# Patient Record
Sex: Female | Born: 1945 | Race: Black or African American | Hispanic: No | Marital: Married | Smoking: Never smoker
Health system: Southern US, Community
[De-identification: ages and names within clinical notes are randomized; demographics above are authoritative.]

## PROBLEM LIST (undated history)

## (undated) DIAGNOSIS — Z8041 Family history of malignant neoplasm of ovary: Secondary | ICD-10-CM

## (undated) DIAGNOSIS — E7800 Pure hypercholesterolemia, unspecified: Secondary | ICD-10-CM

## (undated) DIAGNOSIS — F0390 Unspecified dementia without behavioral disturbance: Secondary | ICD-10-CM

## (undated) DIAGNOSIS — R413 Other amnesia: Secondary | ICD-10-CM

## (undated) DIAGNOSIS — I10 Essential (primary) hypertension: Secondary | ICD-10-CM

## (undated) HISTORY — PX: BREAST SURGERY: SHX581

## (undated) HISTORY — DX: Other amnesia: R41.3

## (undated) HISTORY — DX: Family history of malignant neoplasm of ovary: Z80.41

## (undated) HISTORY — DX: Unspecified dementia, unspecified severity, without behavioral disturbance, psychotic disturbance, mood disturbance, and anxiety: F03.90

## (undated) HISTORY — DX: Pure hypercholesterolemia, unspecified: E78.00

## (undated) HISTORY — DX: Essential (primary) hypertension: I10

---

## 1975-03-29 HISTORY — PX: TUBAL LIGATION: SHX77

## 1995-03-29 HISTORY — PX: ENDOMETRIAL ABLATION: SHX621

## 1998-10-01 ENCOUNTER — Other Ambulatory Visit: Admission: RE | Admit: 1998-10-01 | Discharge: 1998-10-01 | Payer: Self-pay | Admitting: Gynecology

## 1998-10-01 ENCOUNTER — Encounter (INDEPENDENT_AMBULATORY_CARE_PROVIDER_SITE_OTHER): Payer: Self-pay | Admitting: Specialist

## 2000-03-28 HISTORY — PX: COLONOSCOPY W/ POLYPECTOMY: SHX1380

## 2000-11-16 ENCOUNTER — Other Ambulatory Visit: Admission: RE | Admit: 2000-11-16 | Discharge: 2000-11-16 | Payer: Self-pay | Admitting: Gynecology

## 2001-01-10 ENCOUNTER — Ambulatory Visit (HOSPITAL_COMMUNITY): Admission: RE | Admit: 2001-01-10 | Discharge: 2001-01-10 | Payer: Self-pay | Admitting: Gastroenterology

## 2001-03-07 ENCOUNTER — Ambulatory Visit (HOSPITAL_COMMUNITY): Admission: RE | Admit: 2001-03-07 | Discharge: 2001-03-07 | Payer: Self-pay | Admitting: Gastroenterology

## 2001-03-07 ENCOUNTER — Encounter (INDEPENDENT_AMBULATORY_CARE_PROVIDER_SITE_OTHER): Payer: Self-pay | Admitting: *Deleted

## 2001-11-20 ENCOUNTER — Other Ambulatory Visit: Admission: RE | Admit: 2001-11-20 | Discharge: 2001-11-20 | Payer: Self-pay | Admitting: Gynecology

## 2002-11-22 ENCOUNTER — Other Ambulatory Visit: Admission: RE | Admit: 2002-11-22 | Discharge: 2002-11-22 | Payer: Self-pay | Admitting: Gynecology

## 2004-01-01 ENCOUNTER — Other Ambulatory Visit: Admission: RE | Admit: 2004-01-01 | Discharge: 2004-01-01 | Payer: Self-pay | Admitting: Gynecology

## 2005-01-10 ENCOUNTER — Other Ambulatory Visit: Admission: RE | Admit: 2005-01-10 | Discharge: 2005-01-10 | Payer: Self-pay | Admitting: Gynecology

## 2006-01-11 ENCOUNTER — Other Ambulatory Visit: Admission: RE | Admit: 2006-01-11 | Discharge: 2006-01-11 | Payer: Self-pay | Admitting: Gynecology

## 2007-01-15 ENCOUNTER — Other Ambulatory Visit: Admission: RE | Admit: 2007-01-15 | Discharge: 2007-01-15 | Payer: Self-pay | Admitting: Gynecology

## 2009-12-28 ENCOUNTER — Other Ambulatory Visit: Admission: RE | Admit: 2009-12-28 | Discharge: 2009-12-28 | Payer: Self-pay | Admitting: Gynecology

## 2009-12-28 ENCOUNTER — Ambulatory Visit: Payer: Self-pay | Admitting: Gynecology

## 2010-08-13 NOTE — Procedures (Signed)
. [facility]  Patient:    Elizabeth Castro, Elizabeth Castro Visit Number: [id number] MRN: [id number]          Service Type: END Location: ENDO Attending Physician:  Charna Elizabeth Dictated by:   Anselmo Rod, M.D. Proc. Date: 03/07/01 Admit Date:  03/07/2001   CC:         Juan H. Lily Peer, M.D.   Procedure Report  DATE OF BIRTH:  1945/11/12  REFERRING PHYSICIAN:  Gaetano Hawthorne. Lily Peer, M.D.  PROCEDURE PERFORMED:  Colonoscopy with snare polypectomy x 1 and hot biopsy x 1.  ENDOSCOPIST:  Anselmo Rod, M.D.  INSTRUMENT USED:  Olympus video colonoscope.  INDICATIONS FOR PROCEDURE:  Polyp seen on flexible sigmoidoscopy in a 65 year old African-American female, rule out colonic polyps in right and transverse colon.  Polypectomy is planned.  PREPROCEDURE PREPARATION:  Informed consent was procured from the patient. The patient was fasted for eight hours prior to the procedure and prepped with a bottle of magnesium citrate and a gallon of NuLytely the night prior to the procedure.  PREPROCEDURE PHYSICAL:  The patient had stable vital signs.  Neck supple. Chest clear to auscultation.  S1, S2 regular.  Abdomen soft with normal abdominal bowel sounds.  No evidence of hepatosplenomegaly.  DESCRIPTION OF PROCEDURE:  The patient was placed in the left lateral decubitus position and sedated with 60 mg of Demerol and 6 mg of Versed intravenously.  Once the patient was adequately sedated and maintained on low-flow oxygen and continuous cardiac monitoring, the Olympus video colonoscope was advanced from the rectum to the cecum with difficulty secondary to a large amount of residual stool in the colon.  Multiple washes were done.  A small polyp was seen at 90 cm. This was removed by hot biopsy forceps.  Another larger polyp measuring about 4 to 5 mm was removed by snare polypectomy from 10 cm.  Both the polyps were placed in the same bottle. Small lesions could  have been missed.  The procedure was complete up to the cecum. The ileocecal valve and the appendiceal orifice were clearly visualized and photographed.  No large masses or polyps were present.  IMPRESSION: 1. Two polyps removed from the colon as mentioned above. 2. Some residual stool in the colon.  Small lesions could have been missed. 3. No large masses or polyps seen.  RECOMMENDATIONS: 1. Await pathology results. 2. Avoid all non-steroidals including aspirin for the next four weeks. 3. Outpatient follow-up in the next four weeks for further recommendation.Dictated by:   Anselmo Rod, M.D. Attending Physician:  Charna Elizabeth DD:  03/07/01 TD:  03/07/01 Job: [id number]

## 2010-08-13 NOTE — Procedures (Signed)
[place]  Patient:    Elizabeth Castro, Elizabeth Castro Visit Number: [id number] MRN: [id number]          Service Type: END Location: ENDO Attending Physician:  Charna Elizabeth Dictated by:   Anselmo Rod, M.D. Proc. Date: 01/10/01 Admit Date:  01/10/2001   CC:         Wayne C. Dorna Bloom, M.D.   Procedure Report  DATE OF BIRTH:  May 02, 1945.  PROCEDURE:  Flexible sigmoidoscopy up to 50 cm.  ENDOSCOPIST:  Anselmo Rod, M.D.  INSTRUMENT USED:  Pediatric Olympus colonoscope.  INDICATION FOR PROCEDURE:  Screening flexible sigmoidoscopy being performed in a 65 year old African-American female.  Rule out colonic polyps, masses, hemorrhoids, etc.  PREPROCEDURE PREPARATION:  Informed consent was procured from the patient. The patient was fasted for eight hours prior to the procedure and prepped with two Fleets enemas the morning of the procedure.  PREPROCEDURE PHYSICAL:  VITAL SIGNS:  The patient had stable vital signs.  NECK:  Supple.  CHEST:  Clear to auscultation.  S1, S2 regular.  ABDOMEN:  Soft with normal bowel sounds.  DESCRIPTION OF PROCEDURE:  The patient was placed in the left lateral decubitus position.  No sedation was used.  Once the patient was adequately positioned, the Olympus video colonoscope was advanced from the rectum to 40 cm without difficulty.  There was some mucoid residue in the colon.  Small sessile polyp was seen at 10 cm, and no other abnormalities were noted.  The procedure was aborted at this point with plans to do a colonoscopy later.  IMPRESSION:  Polyp seen at 10 cm.  RECOMMENDATIONS:  Colonoscopy is indicated for the patient to rule out other colonic polyps and remove the polyp seen on flexible sigmoidoscopy today. Dictated by:   Anselmo Rod, M.D. Attending Physician:  Charna Elizabeth DD:  01/10/01 TD:  01/10/01 Job: [id number]

## 2011-01-05 ENCOUNTER — Encounter: Payer: Self-pay | Admitting: Anesthesiology

## 2011-01-11 ENCOUNTER — Other Ambulatory Visit (HOSPITAL_COMMUNITY)
Admission: RE | Admit: 2011-01-11 | Discharge: 2011-01-11 | Disposition: A | Discharge status: Home / Self Care | Payer: Medicare Other | Source: Ambulatory Visit | Attending: Gynecology | Admitting: Gynecology

## 2011-01-11 ENCOUNTER — Ambulatory Visit (INDEPENDENT_AMBULATORY_CARE_PROVIDER_SITE_OTHER): Payer: Medicare Other | Admitting: Gynecology

## 2011-01-11 ENCOUNTER — Encounter: Payer: Self-pay | Admitting: Gynecology

## 2011-01-11 DIAGNOSIS — Z131 Encounter for screening for diabetes mellitus: Secondary | ICD-10-CM

## 2011-01-11 DIAGNOSIS — R141 Gas pain: Secondary | ICD-10-CM

## 2011-01-11 DIAGNOSIS — E663 Overweight: Secondary | ICD-10-CM

## 2011-01-11 DIAGNOSIS — Z8041 Family history of malignant neoplasm of ovary: Secondary | ICD-10-CM

## 2011-01-11 DIAGNOSIS — E7800 Pure hypercholesterolemia, unspecified: Secondary | ICD-10-CM

## 2011-01-11 DIAGNOSIS — Z23 Encounter for immunization: Secondary | ICD-10-CM

## 2011-01-11 DIAGNOSIS — Z1211 Encounter for screening for malignant neoplasm of colon: Secondary | ICD-10-CM

## 2011-01-11 DIAGNOSIS — Z01419 Encounter for gynecological examination (general) (routine) without abnormal findings: Secondary | ICD-10-CM

## 2011-01-11 DIAGNOSIS — Z780 Asymptomatic menopausal state: Secondary | ICD-10-CM

## 2011-01-11 DIAGNOSIS — Z124 Encounter for screening for malignant neoplasm of cervix: Secondary | ICD-10-CM | POA: Insufficient documentation

## 2011-01-11 DIAGNOSIS — Z0000 Encounter for general adult medical examination without abnormal findings: Secondary | ICD-10-CM

## 2011-01-11 DIAGNOSIS — R140 Abdominal distension (gaseous): Secondary | ICD-10-CM

## 2011-01-11 DIAGNOSIS — R142 Eructation: Secondary | ICD-10-CM

## 2011-01-11 LAB — POC HEMOCCULT BLD/STL (OFFICE/1-CARD/DIAGNOSTIC): Fecal Occult Blood, POC: NEGATIVE

## 2011-01-11 NOTE — Progress Notes (Signed)
Elizabeth Castro 03/25/1946 [id number]   History:    65 y.o. who has a strong family history of ovarian cancer in her mother. The patient is overweight she weighs 242 pounds she is taking her calcium and vitamin D daily I have recommended her to get twice a day she is being followed for her hypercholesterolemia by Dr. Eula Castro who she has not seen in a year. She has a followup appointment this Thursday with the gastroenterologists decided she's had colonoscopy in 2002 with colonic polyps. She is fasting today. She is overdue for mammogram. Her last bone density study was normal in 2007.   Past medical history,surgical history, family history and social history were all reviewed and documented in the EPIC chart.  ROS:  Was performed and pertinent positives and negatives are included in the history.  Exam: chaperone present BP 130/74  Ht 5\' 5"  (1.651 m)  Wt 242 lb (109.77 kg)  BMI 40.27 kg/m2  Body mass index is 40.27 kg/(m^2).  General appearance : Well developed well nourished female. No acute distress HEENT: Neck supple, trachea midline, no carotid bruits, no thyroidmegaly Lungs: Clear to auscultation, no rhonchi or wheezes, or rib retractions  Heart: Regular rate and rhythm, no murmurs or gallops Breast:Examined in sitting and supine position were symmetrical in appearance, no palpable masses or tenderness,  no skin retraction, no nipple inversion, no nipple discharge, no skin discoloration, no axillary or supraclavicular lymphadenopathy Abdomen: no palpable masses or tenderness, no rebound or guarding Extremities: no edema or skin discoloration or tenderness  Pelvic:  Bartholin, Urethra, Skene Glands: Within normal limits             Vagina: No gross lesions or discharge  Cervix: No gross lesions or discharge  Uterus  anteverted, normal size, shape and consistency, non-tender and mobile  Adnexa  Without masses or tenderness  Anus and perineum  normal   Rectovaginal  normal sphincter  tone without palpated masses or tenderness             Hemoccult obtained results pending at time of this dictation     Assessment/Plan:  65 y.o. female unremarkable gynecological examination. We'll do a fasting lipid profile along with fasting blood sugar CBC urinalysis and Pap smear along with a CEA 125 Hemoccult test pending at time of dictation. She will schedule an ultrasound next week due to family history of ovarian cancer in her being overweight. The following module scheduled for bone density study which is overdue as well. She was encouraged to increase her calcium and vitamin D twice a day. Patient with prior history last year of a left breast biopsy which was reported to be a benign fibroadenoma. She was requesting a flu shot risk benefits and pros and cons were discussed consent form was signed and he was administered. She was scheduled her mammogram according. We'll wait for the results of the colonoscopy next 2 weeks. Was here back in one year or when necessary.    Elizabeth Edwards MD, 12:58 PM 01/11/2011

## 2011-01-25 ENCOUNTER — Ambulatory Visit (INDEPENDENT_AMBULATORY_CARE_PROVIDER_SITE_OTHER): Payer: Medicare Other

## 2011-01-25 DIAGNOSIS — Z780 Asymptomatic menopausal state: Secondary | ICD-10-CM

## 2011-01-25 DIAGNOSIS — Z13820 Encounter for screening for osteoporosis: Secondary | ICD-10-CM

## 2011-01-28 ENCOUNTER — Ambulatory Visit (INDEPENDENT_AMBULATORY_CARE_PROVIDER_SITE_OTHER): Payer: Medicare Other

## 2011-01-28 ENCOUNTER — Other Ambulatory Visit (HOSPITAL_COMMUNITY)
Admission: RE | Admit: 2011-01-28 | Discharge: 2011-01-28 | Disposition: A | Discharge status: Home / Self Care | Payer: Medicare Other | Source: Ambulatory Visit | Attending: Gynecology | Admitting: Gynecology

## 2011-01-28 ENCOUNTER — Ambulatory Visit (INDEPENDENT_AMBULATORY_CARE_PROVIDER_SITE_OTHER): Payer: Medicare Other | Admitting: Gynecology

## 2011-01-28 ENCOUNTER — Encounter: Payer: Self-pay | Admitting: Gynecology

## 2011-01-28 VITALS — BP 130/82

## 2011-01-28 DIAGNOSIS — Z124 Encounter for screening for malignant neoplasm of cervix: Secondary | ICD-10-CM | POA: Insufficient documentation

## 2011-01-28 DIAGNOSIS — Z8041 Family history of malignant neoplasm of ovary: Secondary | ICD-10-CM

## 2011-01-28 DIAGNOSIS — E7800 Pure hypercholesterolemia, unspecified: Secondary | ICD-10-CM

## 2011-01-28 DIAGNOSIS — R87616 Satisfactory cervical smear but lacking transformation zone: Secondary | ICD-10-CM

## 2011-01-28 NOTE — Progress Notes (Signed)
Patient presented to the office today due to the fact that time of her annual exam recently the Pap smear demonstrated that there was no endocervical cells for evaluation. Review of her record indicated her recent CBC urinalysis and blood sugar was normal. Her lipid profile demonstrated her total cholesterol elevated 216 as was her triglycerides at 182 and LDL of 132. She was encouraged to increase her diet with vegetables fruits and fiber as well as low-fat diet and regular exercise. She is currently on Zocor place by her primary physician. Patient's mother had ovarian cancer and due to patient's abdominal girth needed difficult for an accurate assessment of her adnexa and ultrasound had been ordered and was happy to inform her ultrasound demonstrated normal ovaries and she still has for very small insignificant fibroids. Her endometrial stripe of 3.2 mm and the uterus was otherwise normal size. Her recent bone density study was normal. She'll continue with her calcium and vitamin D is apparently prescribed for osteoporosis prevention.

## 2011-03-08 ENCOUNTER — Telehealth: Payer: Self-pay | Admitting: *Deleted

## 2011-03-08 NOTE — Telephone Encounter (Signed)
Patient called for pap results.  Informed normal.

## 2011-03-10 ENCOUNTER — Encounter: Payer: Self-pay | Admitting: Gynecology

## 2011-07-05 ENCOUNTER — Other Ambulatory Visit: Payer: Self-pay | Admitting: *Deleted

## 2011-07-05 DIAGNOSIS — E7800 Pure hypercholesterolemia, unspecified: Secondary | ICD-10-CM

## 2011-08-04 DIAGNOSIS — N182 Chronic kidney disease, stage 2 (mild): Secondary | ICD-10-CM | POA: Diagnosis not present

## 2011-08-04 DIAGNOSIS — E559 Vitamin D deficiency, unspecified: Secondary | ICD-10-CM | POA: Diagnosis not present

## 2011-08-04 DIAGNOSIS — Z23 Encounter for immunization: Secondary | ICD-10-CM | POA: Diagnosis not present

## 2011-08-04 DIAGNOSIS — Z0000 Encounter for general adult medical examination without abnormal findings: Secondary | ICD-10-CM | POA: Diagnosis not present

## 2011-08-04 DIAGNOSIS — I10 Essential (primary) hypertension: Secondary | ICD-10-CM | POA: Diagnosis not present

## 2011-08-04 DIAGNOSIS — E782 Mixed hyperlipidemia: Secondary | ICD-10-CM | POA: Diagnosis not present

## 2011-08-05 DIAGNOSIS — Z1231 Encounter for screening mammogram for malignant neoplasm of breast: Secondary | ICD-10-CM | POA: Diagnosis not present

## 2011-08-11 ENCOUNTER — Encounter: Payer: Self-pay | Admitting: Gynecology

## 2012-01-12 ENCOUNTER — Ambulatory Visit (INDEPENDENT_AMBULATORY_CARE_PROVIDER_SITE_OTHER): Payer: Medicare Other

## 2012-01-12 ENCOUNTER — Encounter: Payer: Self-pay | Admitting: Gynecology

## 2012-01-12 ENCOUNTER — Ambulatory Visit (INDEPENDENT_AMBULATORY_CARE_PROVIDER_SITE_OTHER): Payer: Medicare Other | Admitting: Gynecology

## 2012-01-12 VITALS — BP 128/86 | Ht 64.5 in | Wt 210.0 lb

## 2012-01-12 DIAGNOSIS — D259 Leiomyoma of uterus, unspecified: Secondary | ICD-10-CM | POA: Diagnosis not present

## 2012-01-12 DIAGNOSIS — N83339 Acquired atrophy of ovary and fallopian tube, unspecified side: Secondary | ICD-10-CM | POA: Diagnosis not present

## 2012-01-12 DIAGNOSIS — Z8041 Family history of malignant neoplasm of ovary: Secondary | ICD-10-CM

## 2012-01-12 DIAGNOSIS — N952 Postmenopausal atrophic vaginitis: Secondary | ICD-10-CM | POA: Diagnosis not present

## 2012-01-12 DIAGNOSIS — Z780 Asymptomatic menopausal state: Secondary | ICD-10-CM

## 2012-01-12 DIAGNOSIS — D219 Benign neoplasm of connective and other soft tissue, unspecified: Secondary | ICD-10-CM

## 2012-01-12 DIAGNOSIS — R634 Abnormal weight loss: Secondary | ICD-10-CM | POA: Diagnosis not present

## 2012-01-12 DIAGNOSIS — Z23 Encounter for immunization: Secondary | ICD-10-CM

## 2012-01-12 DIAGNOSIS — R140 Abdominal distension (gaseous): Secondary | ICD-10-CM

## 2012-01-12 DIAGNOSIS — R141 Gas pain: Secondary | ICD-10-CM

## 2012-01-12 DIAGNOSIS — D251 Intramural leiomyoma of uterus: Secondary | ICD-10-CM | POA: Diagnosis not present

## 2012-01-12 NOTE — Progress Notes (Signed)
Elizabeth Castro [birth year] [id number]   History:    66 y.o.  for annual gyn exam with no major complaints except that time she has vaginal dryness. Patient's mother had ovarian cancer. Patient with annual ultrasounds and CA 125. Patient with several small uterine fibroids that have been stable throughout the years. At times she has felt some bloating sensation. Otherwise she's been doing well. Her last bone density study was normal in 2012. She is taking calcium with vitamin D twice a day. She frequently does her self breast examination. Her mammogram was normal this year.  Her primary physician is Dr. Eula Listen who has been doing her lab work in following her for her hypercholesterolemia and hypertension. Patient many years ago had benign left breast biopsy. Patient states that she has received her shingles vaccine. She is not sure about her Tdap vaccine.  Past medical history,surgical history, family history and social history were all reviewed and documented in the EPIC chart.  Gynecologic History No LMP recorded. Patient is postmenopausal. Contraception: tubal ligation Last Pap: 2012. Results were: normal Last mammogram: 2013. Results were: normal  Obstetric History OB History    Grav Para Term Preterm Abortions TAB SAB Ect Mult Living   2 2        2      # Outc Date GA Lbr Len/2nd Wgt Sex Del Anes PTL Lv   1 PAR     F SVD      2 PAR     F SVD          ROS: A ROS was performed and pertinent positives and negatives are included in the history.  GENERAL: No fevers or chills. HEENT: No change in vision, no earache, sore throat or sinus congestion. NECK: No pain or stiffness. CARDIOVASCULAR: No chest pain or pressure. No palpitations. PULMONARY: No shortness of breath, cough or wheeze. GASTROINTESTINAL: No abdominal pain, nausea, vomiting or diarrhea, melena or bright red blood per rectum. GENITOURINARY: No urinary frequency, urgency, hesitancy or dysuria. MUSCULOSKELETAL: No joint or muscle  pain, no back pain, no recent trauma. DERMATOLOGIC: No rash, no itching, no lesions. ENDOCRINE: No polyuria, polydipsia, no heat or cold intolerance. No recent change in weight. HEMATOLOGICAL: No anemia or easy bruising or bleeding. NEUROLOGIC: No headache, seizures, numbness, tingling or weakness. PSYCHIATRIC: No depression, no loss of interest in normal activity or change in sleep pattern.     Exam: chaperone present  BP 128/86  Ht 5' 4.5" (1.638 m)  Wt 210 lb (95.255 kg)  BMI 35.49 kg/m2  Body mass index is 35.49 kg/(m^2).  General appearance : Well developed well nourished female. No acute distress HEENT: Neck supple, trachea midline, no carotid bruits, no thyroidmegaly Lungs: Clear to auscultation, no rhonchi or wheezes, or rib retractions  Heart: Regular rate and rhythm, no murmurs or gallops Breast:Examined in sitting and supine position were symmetrical in appearance, no palpable masses or tenderness,  no skin retraction, no nipple inversion, no nipple discharge, no skin discoloration, no axillary or supraclavicular lymphadenopathy Abdomen: no palpable masses or tenderness, no rebound or guarding Extremities: no edema or skin discoloration or tenderness  Pelvic:  Bartholin, Urethra, Skene Glands: Within normal limits             Vagina: No gross lesions or discharge  Cervix: No gross lesions or discharge  Uterus  anteverted irregular shaped limited exam due to patient's abdominal girth, normal size, shape and consistency, non-tender and mobile  Adnexa  Without masses or tenderness  Anus and perineum  normal   Rectovaginal  normal sphincter tone without palpated masses or tenderness             Hemoccult cards provided   Ultrasound today: Uterus measures 7.0 x 4.7 x 2.9 cm with endometrial stripe of 2.6 mm. Both ovaries were atrophic. Patient with sick small intramural myomas the largest one measuring 18 x 16 mm with no change from last year.  Assessment/Plan:  66 y.o. female  for annual exam who was requesting to receive the flu vaccine today and it was administered. She will continue to work on her weight reduction exercises and stay on course with her diet. She was weighing 242 pounds last year and is down to 210 pounds today. She was reminded to continue to do her monthly self breast examination. She will check with her primary physician to see if her Tdap is up-to-date. Ultrasound today stable with normal appearing ovaries and small fibroids. A  CA 125  drawn today results pending at time of this dictation    Ok Edwards MD, 11:14 AM 01/12/2012

## 2012-01-12 NOTE — Patient Instructions (Addendum)
Diphtheria, Tetanus, and Pertussis (DTaP) Vaccine What You Need to Know WHY GET VACCINATED? Diphtheria, tetanus, and pertussis are serious diseases caused by bacteria. Diphtheria and pertussis are spread from person to person. Tetanus enters the body through cuts or wounds. Diphtheria causes a thick covering in the back of the throat.  It can lead to breathing problems, paralysis, heart failure, and even death. Tetanus (Lockjaw) causes painful tightening of the muscles, usually all over the body.  It can lead to "locking" of the jaw so the victim cannot open his or her mouth or swallow. Tetanus leads to death in about 2 out of 10 cases. Pertussis (Whooping Cough) causes coughing spells so bad that it is hard for infants to eat, drink, or breathe. These spells can last for weeks.  It can lead to pneumonia, seizures (jerking and staring spells), brain damage, and death. Diphtheria, tetanus, and pertussis vaccine (DTaP) can help prevent these diseases. Most children who are vaccinated with DTaP will be protected throughout childhood. Many more children would get these diseases if we stopped vaccinating. DTaP is a safer version of an older vaccine called DTP. DTP is no longer used in the [place]. WHO SHOULD GET DTAP VACCINE AND WHEN? Children should get 5 doses of DTaP vaccine, 1 dose at each of the following ages:  2 months.  4 months.  6 months.  15 to 18 months.  4 to 6 years. DTaP may be given at the same time as other vaccines. SOME CHILDREN SHOULD NOT GET DTAP VACCINE OR SHOULD WAIT  Children with minor illnesses, such as a cold, may be vaccinated. But children who are moderately or severely ill should usually wait until they recover before getting DTaP vaccine.  Any child who had a life-threatening allergic reaction after a dose of DTaP should not get another dose.  Any child who suffered a brain or nervous system disease within 7 days after a dose of DTaP should not get  another dose.  Talk with your caregiver if your child:  Had a seizure or collapsed after a dose of DTaP.  Cried non-stop for 3 hours or more after a dose of DTaP.  Had a fever over 105 F (40.6 C) after a dose of DTaP.  Ask your caregiver for more information. Some of these children should not get another dose of pertussis vaccine, but may get a vaccine without pertussis, called DT. OLDER CHILDREN AND ADULTS  DTaP is not licensed for adolescents, adults, or children 7 years of age and older.  But older people still need protection. A vaccine called Tdap is similar to DTaP. A single dose of Tdap is recommended for people 11 through 66 years of age. Another vaccine, called Td, protects against tetanus and diphtheria, but not pertussis. It is recommended every 10 years. WHAT ARE THE RISKS FROM DTAP VACCINE?  Getting diphtheria, tetanus, or pertussis disease is much riskier than getting DTaP vaccine.  However, a vaccine, like any medicine, is capable of causing serious problems, such as severe allergic reactions. The risk of DTaP vaccine causing serious harm, or death, is extremely small. Mild Problems (Common)  Fever (up to about 1 child in 4).  Redness or swelling where the shot was given (up to about 1 child in 4).  Soreness or tenderness where the shot was given (up to about 1 child in 4). These problems occur more often after the 4th and 5th doses of the DTaP series than after earlier doses. Sometimes the 4th   or 5th dose of DTaP vaccine is followed by swelling of the entire arm or leg in which the shot was given, lasting 1 to 7 days (up to about 1 child in 30). Other mild problems include:  Fussiness (up to about 1 child in 3).  Tiredness or poor appetite (up to about 1 child in 10).  Vomiting (up to about 1 child in 50). These problems generally occur 1 to 3 days after the shot. Moderate Problems (Uncommon)  Seizure (jerking or staring) (about 1 child out of  14,000).  Non-stop crying, for 3 hours or more (up to about 1 child out of 1,000).  High fever, over 105 F (40.6 C) (about 1 child out of 16,000). Severe Problems (Very Rare)  Serious allergic reaction (less than 1 out of a million doses).  Several other severe problems have been reported after DTaP vaccine. These include:  Long-term seizures, coma, or lowered consciousness.  Permanent brain damage. These are so rare it is hard to tell if they are caused by the vaccine. Controlling fever is especially important for children who have had seizures, for any reason. It is also important if another family member has had seizures. You can reduce fever and pain by giving your child an aspirin-free pain reliever when the shot is given, and for the next 24 hours, following the package instructions. WHAT IF THERE IS A MODERATE OR SEVERE REACTION? What should I look for? Any unusual conditions, such as a serious allergic reaction, high fever, or unusual behavior. Serious allergic reactions are extremely rare with any vaccine. If one were to occur, it would most likely be within a few minutes to a few hours after the shot. Signs can include difficulty breathing, hoarseness or wheezing, hives, paleness, weakness, a fast heartbeat, or dizziness. If a high fever or seizure were to occur, it would usually be within a week after the shot. What should I do?  Call your caregiver or get the person to a caregiver right away.  Tell the caregiver what happened, the date and time it happened, and when the vaccination was given.  Ask the caregiver, nurse, or health department to file a Vaccine Adverse Event Reporting System (VAERS) form. Or, you can file this report through the VAERS website at www.vaers.hhs.gov or by calling 1-800-822-7967. VAERS does not provide medical advice. THE NATIONAL VACCINE INJURY COMPENSATION PROGRAM  In the rare event that you or your child has a serious reaction to a vaccine, a  federal program has been created to help you pay for the care of those who have been harmed.  For details about the National Vaccine Injury Compensation Program, call 1-800-338-2382 or visit the program's website at www.hrsa.gov/vaccinecompensation HOW CAN I LEARN MORE?  Ask your caregiver. They can give you the vaccine package insert or suggest other sources of information.  Call your local or state health department's immunization program.  Contact the Centers for Disease Control and Prevention (CDC):  Call 1-800-232-4636 (1-800-CDC-INFO).  Visit the National Immunization Program's website at www.cdc.gov/vaccines CDC Diphtheria, Tetanus, and Pertussis (DTaP) Vaccine VIS (08/11/05) Document Released: 01/09/2006 Document Revised: 06/06/2011 Document Reviewed: 01/09/2006 [employer] Patient Information 2013 [employer].  

## 2012-08-29 DIAGNOSIS — H25019 Cortical age-related cataract, unspecified eye: Secondary | ICD-10-CM | POA: Diagnosis not present

## 2012-08-29 DIAGNOSIS — H524 Presbyopia: Secondary | ICD-10-CM | POA: Diagnosis not present

## 2012-08-29 DIAGNOSIS — H2510 Age-related nuclear cataract, unspecified eye: Secondary | ICD-10-CM | POA: Diagnosis not present

## 2012-08-29 DIAGNOSIS — H5200 Hypermetropia, unspecified eye: Secondary | ICD-10-CM | POA: Diagnosis not present

## 2012-09-11 DIAGNOSIS — H2510 Age-related nuclear cataract, unspecified eye: Secondary | ICD-10-CM | POA: Diagnosis not present

## 2012-09-11 DIAGNOSIS — H25019 Cortical age-related cataract, unspecified eye: Secondary | ICD-10-CM | POA: Diagnosis not present

## 2012-09-11 DIAGNOSIS — H2589 Other age-related cataract: Secondary | ICD-10-CM | POA: Diagnosis not present

## 2012-10-23 DIAGNOSIS — H2589 Other age-related cataract: Secondary | ICD-10-CM | POA: Diagnosis not present

## 2012-10-23 DIAGNOSIS — H2510 Age-related nuclear cataract, unspecified eye: Secondary | ICD-10-CM | POA: Diagnosis not present

## 2012-10-23 DIAGNOSIS — H25019 Cortical age-related cataract, unspecified eye: Secondary | ICD-10-CM | POA: Diagnosis not present

## 2012-11-27 DIAGNOSIS — R059 Cough, unspecified: Secondary | ICD-10-CM | POA: Diagnosis not present

## 2012-11-27 DIAGNOSIS — N182 Chronic kidney disease, stage 2 (mild): Secondary | ICD-10-CM | POA: Diagnosis not present

## 2012-11-27 DIAGNOSIS — Z1331 Encounter for screening for depression: Secondary | ICD-10-CM | POA: Diagnosis not present

## 2012-11-27 DIAGNOSIS — R609 Edema, unspecified: Secondary | ICD-10-CM | POA: Diagnosis not present

## 2012-11-27 DIAGNOSIS — E782 Mixed hyperlipidemia: Secondary | ICD-10-CM | POA: Diagnosis not present

## 2012-11-27 DIAGNOSIS — Z0000 Encounter for general adult medical examination without abnormal findings: Secondary | ICD-10-CM | POA: Diagnosis not present

## 2012-11-27 DIAGNOSIS — R05 Cough: Secondary | ICD-10-CM | POA: Diagnosis not present

## 2012-11-27 DIAGNOSIS — I10 Essential (primary) hypertension: Secondary | ICD-10-CM | POA: Diagnosis not present

## 2012-11-30 DIAGNOSIS — Z1231 Encounter for screening mammogram for malignant neoplasm of breast: Secondary | ICD-10-CM | POA: Diagnosis not present

## 2012-12-04 ENCOUNTER — Other Ambulatory Visit: Payer: Self-pay | Admitting: Internal Medicine

## 2012-12-04 DIAGNOSIS — R51 Headache: Secondary | ICD-10-CM | POA: Diagnosis not present

## 2012-12-04 DIAGNOSIS — R519 Headache, unspecified: Secondary | ICD-10-CM

## 2012-12-10 ENCOUNTER — Ambulatory Visit
Admission: RE | Admit: 2012-12-10 | Discharge: 2012-12-10 | Disposition: A | Discharge status: Home / Self Care | Payer: Medicare Other | Source: Ambulatory Visit | Attending: Internal Medicine | Admitting: Internal Medicine

## 2012-12-10 DIAGNOSIS — R519 Headache, unspecified: Secondary | ICD-10-CM

## 2012-12-10 DIAGNOSIS — R51 Headache: Secondary | ICD-10-CM | POA: Diagnosis not present

## 2012-12-12 DIAGNOSIS — N182 Chronic kidney disease, stage 2 (mild): Secondary | ICD-10-CM | POA: Diagnosis not present

## 2012-12-12 DIAGNOSIS — R51 Headache: Secondary | ICD-10-CM | POA: Diagnosis not present

## 2012-12-12 DIAGNOSIS — E7800 Pure hypercholesterolemia, unspecified: Secondary | ICD-10-CM | POA: Diagnosis not present

## 2012-12-12 DIAGNOSIS — E782 Mixed hyperlipidemia: Secondary | ICD-10-CM | POA: Diagnosis not present

## 2012-12-12 DIAGNOSIS — R059 Cough, unspecified: Secondary | ICD-10-CM | POA: Diagnosis not present

## 2012-12-12 DIAGNOSIS — R609 Edema, unspecified: Secondary | ICD-10-CM | POA: Diagnosis not present

## 2012-12-12 DIAGNOSIS — I10 Essential (primary) hypertension: Secondary | ICD-10-CM | POA: Diagnosis not present

## 2012-12-12 DIAGNOSIS — Z0000 Encounter for general adult medical examination without abnormal findings: Secondary | ICD-10-CM | POA: Diagnosis not present

## 2012-12-12 DIAGNOSIS — R05 Cough: Secondary | ICD-10-CM | POA: Diagnosis not present

## 2012-12-28 ENCOUNTER — Telehealth: Payer: Self-pay | Admitting: Interventional Cardiology

## 2012-12-28 NOTE — Telephone Encounter (Signed)
New Problem  Pt recently had an echo.. She states that her primary care didn't explain the results clearly and request a call back to discuss.

## 2013-01-03 NOTE — Telephone Encounter (Signed)
Follow up  Had echo 10-17-------have questions about results

## 2013-01-03 NOTE — Telephone Encounter (Signed)
Pt had echo 9/17. I am unable to see echo on Echart. Pt was told that dr/ cma will be here tomorrow and will call her back to review echo. Pt agreed to plan.

## 2013-01-06 NOTE — Telephone Encounter (Signed)
I reviewed and no significant abnormality found. Echo read by Dr. Anne Fu

## 2013-01-06 NOTE — Telephone Encounter (Signed)
No significant abnormality found on echo read by Dr Anne Fu

## 2013-01-08 NOTE — Telephone Encounter (Signed)
Discussed the results of echo with the patent

## 2013-01-18 ENCOUNTER — Ambulatory Visit (INDEPENDENT_AMBULATORY_CARE_PROVIDER_SITE_OTHER): Payer: Medicare Other | Admitting: Gynecology

## 2013-01-18 ENCOUNTER — Encounter: Payer: Self-pay | Admitting: Gynecology

## 2013-01-18 VITALS — BP 128/80 | Ht 64.75 in | Wt 209.0 lb

## 2013-01-18 DIAGNOSIS — Z8041 Family history of malignant neoplasm of ovary: Secondary | ICD-10-CM | POA: Diagnosis not present

## 2013-01-18 DIAGNOSIS — N951 Menopausal and female climacteric states: Secondary | ICD-10-CM

## 2013-01-18 DIAGNOSIS — N952 Postmenopausal atrophic vaginitis: Secondary | ICD-10-CM

## 2013-01-18 DIAGNOSIS — Z1159 Encounter for screening for other viral diseases: Secondary | ICD-10-CM | POA: Diagnosis not present

## 2013-01-18 DIAGNOSIS — Z23 Encounter for immunization: Secondary | ICD-10-CM

## 2013-01-18 DIAGNOSIS — Z780 Asymptomatic menopausal state: Secondary | ICD-10-CM

## 2013-01-18 NOTE — Progress Notes (Signed)
Elizabeth Castro 1945-10-12 [id number]   History:    67 y.o.  for GYN exam and followup. Patient has had a history of vaginal atrophy but has not been bothersome to her. Patient's mother had ovarian cancer. Patient with annual ultrasounds and CA 125. Patient with several small uterine fibroids that have been stable throughout the years. At times she has felt some bloating sensation. Otherwise she's been doing well. Her last bone density study was normal in 2012. She is taking calcium with vitamin D twice a day. She frequently does her self breast examination. Her primary physician is Dr. Eula Listen who has been doing her lab work in following her for her hypercholesterolemia and hypertension. Patient many years ago had benign left breast biopsy. Patient states that she has received her shingles vaccine. She is not sure about her Tdap vaccine. Patient request her flu vaccine today. Her colonoscopy was in 2012 was normal.   Past medical history,surgical history, family history and social history were all reviewed and documented in the EPIC chart.  Gynecologic History No LMP recorded. Patient is postmenopausal. Contraception: post menopausal status Last Pap: 2012. Results were: normal Last mammogram: 2013 and 2014. Results were: normal  Obstetric History OB History  Gravida Para Term Preterm AB SAB TAB Ectopic Multiple Living  2 2        2     # Outcome Date GA Lbr Len/2nd Weight Sex Delivery Anes PTL Lv  2 PAR     F SVD     1 PAR     F SVD          ROS: A ROS was performed and pertinent positives and negatives are included in the history.  GENERAL: No fevers or chills. HEENT: No change in vision, no earache, sore throat or sinus congestion. NECK: No pain or stiffness. CARDIOVASCULAR: No chest pain or pressure. No palpitations. PULMONARY: No shortness of breath, cough or wheeze. GASTROINTESTINAL: No abdominal pain, nausea, vomiting or diarrhea, melena or bright red blood per rectum. GENITOURINARY:  No urinary frequency, urgency, hesitancy or dysuria. MUSCULOSKELETAL: No joint or muscle pain, no back pain, no recent trauma. DERMATOLOGIC: No rash, no itching, no lesions. ENDOCRINE: No polyuria, polydipsia, no heat or cold intolerance. No recent change in weight. HEMATOLOGICAL: No anemia or easy bruising or bleeding. NEUROLOGIC: No headache, seizures, numbness, tingling or weakness. PSYCHIATRIC: No depression, no loss of interest in normal activity or change in sleep pattern.     Exam: chaperone present  BP 128/80  Ht 5' 4.75" (1.645 m)  Wt 209 lb (94.802 kg)  BMI 35.03 kg/m2  Body mass index is 35.03 kg/(m^2).  General appearance : Well developed well nourished female. No acute distress HEENT: Neck supple, trachea midline, no carotid bruits, no thyroidmegaly Lungs: Clear to auscultation, no rhonchi or wheezes, or rib retractions  Heart: Regular rate and rhythm, no murmurs or gallops Breast:Examined in sitting and supine position were symmetrical in appearance, no palpable masses or tenderness,  no skin retraction, no nipple inversion, no nipple discharge, no skin discoloration, no axillary or supraclavicular lymphadenopathy Abdomen: no palpable masses or tenderness, no rebound or guarding Extremities: no edema or skin discoloration or tenderness  Pelvic:  Bartholin, Urethra, Skene Glands: Within normal limits             Vagina: No gross lesions or discharge  Cervix: No gross lesions or discharge  Uterus  anteverted, normal size, shape and consistency, non-tender and mobile  Adnexa  Without masses or tenderness  Anus and perineum  normal   Rectovaginal  normal sphincter tone without palpated masses or tenderness             Hemoccult card provided     Assessment/Plan:  67 y.o. female with strong family history in her mother with ovarian cancer. Although limited screening had been discussed with her such as annual CA 125 and ultrasound she feels comfortable in doing so despite its  limitations. A CA 125 we'll be ordered today she'll return back for a pelvic ultrasound in the next few weeks. She received the flu vaccine today. Hemoccult cards were provided for her to submit to the office for testing. Pap smear was not done today in accordance to the new guidelines. Patient was could her bone density study for December of this year.  New CDC guidelines is recommending patients be tested once in her lifetime for hepatitis C antibody who were born between [birth year] through 1965. This was discussed with the patient today and has agreed to be tested today.  Note: This dictation was prepared with  Dragon/digital dictation along withSmart phrase technology. Any transcriptional errors that result from this process are unintentional.   Ok Edwards MD, 1:33 PM 01/18/2013

## 2013-01-18 NOTE — Patient Instructions (Signed)
Influenza Vaccine (Flu Vaccine, Live, Intranasal) 2013 2014 What You Need to Know WHY GET VACCINATED?   Influenza ("flu") is a contagious disease that spreads around the [place] every winter, usually between October and May.  Flu is caused by the influenza virus, and can be spread by coughing, sneezing, and close contact.  Anyone can get flu, but the risk of getting flu is highest among children. Symptoms come on suddenly and may last several days. They can include:  Fever or chills.  Sore throat.  Muscle aches.  Fatigue.  Cough.  Headache.  Runny or stuffy nose. Flu can make some people much sicker than others. These people include young children, people 32 and older, pregnant women, and people with certain health conditions such as heart, lung or kidney disease, or a weakened immune system. Flu vaccine is especially important for these people, and anyone in close contact with them. Flu can also lead to pneumonia, and make existing medical conditions worse. It can cause diarrhea and seizures in children. Each year thousands of people in the [place] die from flu, and many more are hospitalized. Flu vaccine is the best protection we have from flu and its complications. Flu vaccine also helps prevent spreading flu from person to person. LIVE, ATTENUATED INFLUENZA VACCINE LAIV, NASAL SPRAY There are 2 types of influenza vaccine:  You are getting a live, attenuated influenza vaccine (called LAIV), which is sprayed into the nose. "Attenuated" means weakened. The viruses in the vaccine have been weakened so they cannot make you sick.  A different vaccine, the "flu shot," is an inactivated vaccine (not containing live virus). It is given by injection with a needle. This vaccine is described in a separate Vaccine Information Statement. Flu vaccine is recommended every year. Children 6 months through 46 years of age should get 2 doses the first year they get vaccinated. Flu  viruses are always changing. Each year's flu vaccine is made to protect from viruses that are most likely to cause disease that year. While flu vaccine cannot prevent all cases of flu, it is our best defense against the disease. LAIV protects against 4 different influenza viruses. It takes about 2 weeks for protection to develop after the vaccination, and protection lasts several months to a year. Some illnesses that are not caused by influenza virus are often mistaken for flu. Flu vaccine will not prevent these illnesses. It can only prevent influenza. LAIV may be given to people 2 through 67 years of age, who are not pregnant. It may safely be given at the same time as other vaccines. LAIV does not contain thimerosal or other preservatives. SOME PEOPLE SHOULD NOT GET THIS VACCINE Tell the person who gives you the vaccine:  If you have any severe (life-threatening) allergies, including an allergy to eggs. If you ever had a life-threatening allergic reaction after a dose of flu vaccine, or have a severe allergy to any part of this vaccine, you should not get a dose.  If you ever had Guillain Barr Syndrome (a severe paralyzing illness, also called GBS). Some people with a history of GBS should not get this vaccine. This should be discussed with your doctor.  If you have gotten any other vaccines in the past 4 weeks, or if you are not feeling well. They might suggest waiting. But you should come back.  You should get the flu shot instead of the nasal spray if you:  Are pregnant.  Have a weakened immune system.  Have  certain long-term health problems.  Are a young child with asthma or wheezing problems.  Are a child or adolescent on long-term aspirin therapy.  Have close contact with someone who needs special care for an extremely weakened immune system.  Are younger than 2 or older than 49 years. (Children 6 months and older can get the flu shot. Children younger than 6 months cannot get  either vaccine.) The person giving you the vaccine can give you more information. RISKS OF A VACCINE REACTION With a vaccine, like any medicine, there is a chance of side effects. These are usually mild and go away on their own. Serious side effects are also possible, but are very rare. LAIV is made from weakened virus and does not cause flu. Mild problems that have been reported following LAIV: Children and adolescents 28 7 years of age:  Runny nose, nasal congestion, or cough.  Fever.  Headache and muscle aches.  Wheezing.  Abdominal pain or occasional vomiting or diarrhea. Adults 14 67 years of age:  Runny nose or nasal congestion.  Sore throat.  Cough, chills, tiredness, or weakness.  Headache. Severe problems that could follow LAIV:  A severe allergic reaction could occur after any vaccine (estimated less than 1 in a million doses). The safety of vaccines is always being monitored. For more information, visit: http://floyd.org/ WHAT IF THERE IS A SERIOUS REACTION?  What should I look for?  Look for anything that concerns you, such as signs of a severe allergic reaction, very high fever, or behavior changes. Signs of a severe allergic reaction can include hives, swelling of the face and throat, difficulty breathing, a fast heartbeat, dizziness, and weakness. These would start a few minutes to a few hours after the vaccination. What should I do?  If you think it is a severe allergic reaction or other emergency that cannot wait, call 9 1 1  or get the person to the nearest hospital. Otherwise, call your doctor.  Afterward, the reaction should be reported to the Vaccine Adverse Event Reporting System (VAERS). Your doctor might file this report, or you can do it yourself through the VAERS website at www.vaers.LAgents.no, or by calling 1-249-610-5853. VAERS is only for reporting reactions. They do not give medical advice. THE NATIONAL VACCINE INJURY COMPENSATION PROGRAM   The National Vaccine Injury Compensation Program (VICP) is a federal program that was created to compensate people who may have been injured by certain vaccines. Persons who believe they may have been injured by a vaccine can learn about the program and about filing a claim by calling 1-941-676-9289 or visiting the VICP website at SpiritualWord.at HOW CAN I LEARN MORE?   Ask your doctor.  Call your local or state health department.  Contact the Centers for Disease Control and Prevention (CDC):  Call 567-064-2244 (1-800-CDC-INFO) or  Visit the CDC's website at BiotechRoom.com.cy CDC Live, Attenuated Intranasal Influenza Vaccine Interim VIS (10/21/11) Document Released: 04/16/2010 Document Revised: 12/07/2011 Document Reviewed: 10/21/2011 [facility] Patient Information 2014 [place], [place].

## 2013-01-19 LAB — HEPATITIS C ANTIBODY: HCV Ab: NEGATIVE

## 2013-01-21 NOTE — Progress Notes (Signed)
Left message for pt to call.

## 2013-01-30 ENCOUNTER — Encounter: Payer: Self-pay | Admitting: Gynecology

## 2013-01-30 ENCOUNTER — Ambulatory Visit (INDEPENDENT_AMBULATORY_CARE_PROVIDER_SITE_OTHER): Payer: Medicare Other | Admitting: Gynecology

## 2013-01-30 ENCOUNTER — Ambulatory Visit (INDEPENDENT_AMBULATORY_CARE_PROVIDER_SITE_OTHER): Payer: Medicare Other

## 2013-01-30 ENCOUNTER — Other Ambulatory Visit: Payer: Self-pay | Admitting: Gynecology

## 2013-01-30 VITALS — BP 126/74

## 2013-01-30 DIAGNOSIS — N83339 Acquired atrophy of ovary and fallopian tube, unspecified side: Secondary | ICD-10-CM

## 2013-01-30 DIAGNOSIS — D251 Intramural leiomyoma of uterus: Secondary | ICD-10-CM

## 2013-01-30 DIAGNOSIS — Z8041 Family history of malignant neoplasm of ovary: Secondary | ICD-10-CM

## 2013-01-30 DIAGNOSIS — D259 Leiomyoma of uterus, unspecified: Secondary | ICD-10-CM

## 2013-01-30 DIAGNOSIS — N952 Postmenopausal atrophic vaginitis: Secondary | ICD-10-CM

## 2013-01-30 DIAGNOSIS — R140 Abdominal distension (gaseous): Secondary | ICD-10-CM

## 2013-01-30 DIAGNOSIS — R141 Gas pain: Secondary | ICD-10-CM | POA: Diagnosis not present

## 2013-01-30 NOTE — Progress Notes (Signed)
The patient presented to the office today to discuss her ultrasound report. Patient seen in the office for her annual exam on 01/18/2013. Patient has had a history of vaginal atrophy but has not been bothersome to her. Patient's mother had ovarian cancer. Patient with annual ultrasounds and CA 125. Patient with several small uterine fibroids that have been stable throughout the years. At times she has felt some bloating sensation. The patient recently had a normal gynecological exam.  Ultrasound today: Uterus measures 5.9 x 5.2 x 3.6 cm with endometrial stripe at 2.5 mm. Patient with supply small fibroids several calcified. Unchanged from previous year. Right and left ovary atrophic.  Patient with normal CA 125.  Assessment/plan: Patient with family history of ovarian cancer her normal ultrasound normal CA 125 although patient is aware its limitations. Stable fibroid uterus. Patient otherwise scheduled to return back next year for annual exam or when necessary.

## 2013-03-12 ENCOUNTER — Other Ambulatory Visit: Payer: Self-pay | Admitting: Gynecology

## 2013-03-12 ENCOUNTER — Ambulatory Visit (INDEPENDENT_AMBULATORY_CARE_PROVIDER_SITE_OTHER): Payer: Medicare Other

## 2013-03-12 DIAGNOSIS — Z780 Asymptomatic menopausal state: Secondary | ICD-10-CM

## 2013-06-11 DIAGNOSIS — E782 Mixed hyperlipidemia: Secondary | ICD-10-CM | POA: Diagnosis not present

## 2013-06-11 DIAGNOSIS — M1990 Unspecified osteoarthritis, unspecified site: Secondary | ICD-10-CM | POA: Diagnosis not present

## 2013-06-11 DIAGNOSIS — I10 Essential (primary) hypertension: Secondary | ICD-10-CM | POA: Diagnosis not present

## 2013-06-11 DIAGNOSIS — N182 Chronic kidney disease, stage 2 (mild): Secondary | ICD-10-CM | POA: Diagnosis not present

## 2013-12-03 DIAGNOSIS — Z1231 Encounter for screening mammogram for malignant neoplasm of breast: Secondary | ICD-10-CM | POA: Diagnosis not present

## 2013-12-04 ENCOUNTER — Encounter: Payer: Self-pay | Admitting: Gynecology

## 2013-12-13 DIAGNOSIS — Z0000 Encounter for general adult medical examination without abnormal findings: Secondary | ICD-10-CM | POA: Diagnosis not present

## 2013-12-13 DIAGNOSIS — E782 Mixed hyperlipidemia: Secondary | ICD-10-CM | POA: Diagnosis not present

## 2013-12-13 DIAGNOSIS — Z1331 Encounter for screening for depression: Secondary | ICD-10-CM | POA: Diagnosis not present

## 2013-12-13 DIAGNOSIS — N182 Chronic kidney disease, stage 2 (mild): Secondary | ICD-10-CM | POA: Diagnosis not present

## 2013-12-13 DIAGNOSIS — I8390 Asymptomatic varicose veins of unspecified lower extremity: Secondary | ICD-10-CM | POA: Diagnosis not present

## 2013-12-13 DIAGNOSIS — I10 Essential (primary) hypertension: Secondary | ICD-10-CM | POA: Diagnosis not present

## 2013-12-13 DIAGNOSIS — Z23 Encounter for immunization: Secondary | ICD-10-CM | POA: Diagnosis not present

## 2013-12-13 DIAGNOSIS — M1990 Unspecified osteoarthritis, unspecified site: Secondary | ICD-10-CM | POA: Diagnosis not present

## 2013-12-13 DIAGNOSIS — E669 Obesity, unspecified: Secondary | ICD-10-CM | POA: Diagnosis not present

## 2014-01-27 ENCOUNTER — Encounter: Payer: Self-pay | Admitting: Gynecology

## 2014-02-11 ENCOUNTER — Other Ambulatory Visit (HOSPITAL_COMMUNITY)
Admission: RE | Admit: 2014-02-11 | Discharge: 2014-02-11 | Disposition: A | Discharge status: Home / Self Care | Payer: Medicare Other | Source: Ambulatory Visit | Attending: Gynecology | Admitting: Gynecology

## 2014-02-11 ENCOUNTER — Ambulatory Visit (INDEPENDENT_AMBULATORY_CARE_PROVIDER_SITE_OTHER): Payer: Medicare Other | Admitting: Gynecology

## 2014-02-11 ENCOUNTER — Encounter: Payer: Self-pay | Admitting: Gynecology

## 2014-02-11 VITALS — BP 132/78 | Ht 64.5 in | Wt 202.0 lb

## 2014-02-11 DIAGNOSIS — Z8041 Family history of malignant neoplasm of ovary: Secondary | ICD-10-CM

## 2014-02-11 DIAGNOSIS — N952 Postmenopausal atrophic vaginitis: Secondary | ICD-10-CM | POA: Diagnosis not present

## 2014-02-11 DIAGNOSIS — Z1151 Encounter for screening for human papillomavirus (HPV): Secondary | ICD-10-CM | POA: Diagnosis not present

## 2014-02-11 DIAGNOSIS — Z124 Encounter for screening for malignant neoplasm of cervix: Secondary | ICD-10-CM

## 2014-02-11 DIAGNOSIS — N951 Menopausal and female climacteric states: Secondary | ICD-10-CM

## 2014-02-11 DIAGNOSIS — Z01419 Encounter for gynecological examination (general) (routine) without abnormal findings: Secondary | ICD-10-CM | POA: Diagnosis not present

## 2014-02-11 DIAGNOSIS — E663 Overweight: Secondary | ICD-10-CM | POA: Diagnosis not present

## 2014-02-11 NOTE — Addendum Note (Signed)
Addended by: Thurnell Garbe A on: 02/11/2014 12:20 PM   Modules accepted: Orders

## 2014-02-11 NOTE — Progress Notes (Signed)
Elizabeth Castro 02-16-1946 [id number]   History:    68 y.o.  For GYN exam and follow-up. Patient has a strong family history of ovarian cancer (mother). Patient's been followed with annual ultrasounds and CA-125 although patient knows this limitations. Patient does have history of vaginal atrophy but is not bothersome she is on no hormone replacement therapy. Her last bone density study was in 2014 which was normal. Patient's flu vaccine, shingles vaccine, and  Tdap Vaccine, pneumococcal vaccine are all up-to-date. Patient with no past history of abnormal Pap smears. Her PCP is Dr. Deforest Hoyles who is been doing her blood work. Patient is trying to exercise and eat better. She was weighing 209 pounds last year and is down to 202 pounds now. Patient had a normal colonoscopy in 2012. Patient reports no past history of colon polyps.  Past medical history,surgical history, family history and social history were all reviewed and documented in the EPIC chart.  Gynecologic History No LMP recorded. Patient is postmenopausal. Contraception: post menopausal status Last Pap: 2012. Results were: normal Last mammogram: 2015. Results were: normal  Obstetric History OB History  Gravida Para Term Preterm AB SAB TAB Ectopic Multiple Living  2 2        2     # Outcome Date GA Lbr Len/2nd Weight Sex Delivery Anes PTL Lv  2 Para     F Vag-Spont     1 Para     F Vag-Spont          ROS: A ROS was performed and pertinent positives and negatives are included in the history.  GENERAL: No fevers or chills. HEENT: No change in vision, no earache, sore throat or sinus congestion. NECK: No pain or stiffness. CARDIOVASCULAR: No chest pain or pressure. No palpitations. PULMONARY: No shortness of breath, cough or wheeze. GASTROINTESTINAL: No abdominal pain, nausea, vomiting or diarrhea, melena or bright red blood per rectum. GENITOURINARY: No urinary frequency, urgency, hesitancy or dysuria. MUSCULOSKELETAL: No joint or  muscle pain, no back pain, no recent trauma. DERMATOLOGIC: No rash, no itching, no lesions. ENDOCRINE: No polyuria, polydipsia, no heat or cold intolerance. No recent change in weight. HEMATOLOGICAL: No anemia or easy bruising or bleeding. NEUROLOGIC: No headache, seizures, numbness, tingling or weakness. PSYCHIATRIC: No depression, no loss of interest in normal activity or change in sleep pattern.     Exam: chaperone present  BP 132/78 mmHg  Ht 5' 4.5" (1.638 m)  Wt 202 lb (91.627 kg)  BMI 34.15 kg/m2  Body mass index is 34.15 kg/(m^2).  General appearance : Well developed well nourished female. No acute distress HEENT: Neck supple, trachea midline, no carotid bruits, no thyroidmegaly Lungs: Clear to auscultation, no rhonchi or wheezes, or rib retractions  Heart: Regular rate and rhythm, no murmurs or gallops Breast:Examined in sitting and supine position were symmetrical in appearance, no palpable masses or tenderness,  no skin retraction, no nipple inversion, no nipple discharge, no skin discoloration, no axillary or supraclavicular lymphadenopathy Abdomen: no palpable masses or tenderness, no rebound or guarding Extremities: no edema or skin discoloration or tenderness  Pelvic:  Bartholin, Urethra, Skene Glands: Within normal limits             Vagina: No gross lesions or discharge, mild atrophic changes  Cervix: No gross lesions or discharge  Uterus  anteverted, normal size, shape and consistency, non-tender and mobile  Adnexa  Without masses or tenderness  Anus and perineum  normal   Rectovaginal  normal sphincter tone  without palpated masses or tenderness             Hemoccult PCP provides     Assessment/Plan:  68 y.o. female doing well otherwise. PCP has been doing her blood work. We discussed the new screening guidelines for cervical cancer. We will do a Pap smear today and this will be her last one. She will return back to the office for an ultrasound due to the fact that  she is overweight adnexa difficult to feel her ovaries especially with family history of ovarian cancer in her mother. We will draw a CA-125. Patient fully where of its limitations. Patient was reminded on the importance of calcium vitamin D and regular exercise for osteoporosis prevention. She will need a bone density study next year. We also discussed importance of monthly breast exams.   Terrance Mass MD, 11:59 AM 02/11/2014

## 2014-02-12 LAB — CA 125: CA 125: 3 U/mL (ref <35)

## 2014-02-13 LAB — CYTOLOGY - PAP

## 2014-03-07 ENCOUNTER — Ambulatory Visit: Payer: Medicare Other | Admitting: Gynecology

## 2014-03-07 ENCOUNTER — Other Ambulatory Visit: Payer: Medicare Other

## 2014-03-31 ENCOUNTER — Ambulatory Visit (INDEPENDENT_AMBULATORY_CARE_PROVIDER_SITE_OTHER): Payer: Medicare Other | Admitting: Gynecology

## 2014-03-31 ENCOUNTER — Encounter: Payer: Self-pay | Admitting: Gynecology

## 2014-03-31 ENCOUNTER — Ambulatory Visit (INDEPENDENT_AMBULATORY_CARE_PROVIDER_SITE_OTHER): Payer: Medicare Other

## 2014-03-31 ENCOUNTER — Other Ambulatory Visit: Payer: Self-pay | Admitting: Gynecology

## 2014-03-31 DIAGNOSIS — N8331 Acquired atrophy of ovary: Secondary | ICD-10-CM | POA: Diagnosis not present

## 2014-03-31 DIAGNOSIS — Z8041 Family history of malignant neoplasm of ovary: Secondary | ICD-10-CM

## 2014-03-31 DIAGNOSIS — N83319 Acquired atrophy of ovary, unspecified side: Secondary | ICD-10-CM

## 2014-03-31 DIAGNOSIS — D251 Intramural leiomyoma of uterus: Secondary | ICD-10-CM | POA: Diagnosis not present

## 2014-03-31 NOTE — Progress Notes (Signed)
   Patient presented to the office today to discuss her ultrasound. Patient was seen the office in November 17 for her annual exam. The reason for the ultrasound is that patient's mother history of ovarian cancer and she is getting an ultrasound yearly along with a CA-125 as well as her history of fibroid uterus.  Ultrasound today: Uterus measures 6.4 x 5.3 x 4.0 cm within meter stripe of 2 mm. Patient with 2 small fibroids the largest one measuring 21 x 14 mm. Right and left ovary were otherwise normal and atrophic. No adnexal masses and no fluid in the colon sac  C1 25 with a value of 3  Assessment/plan: Postmenopausal patient with family history of ovarian cancer normal ultrasound and CA-125. Fibroid uterus decreasing in size. Patient to return back in one year or when necessary.

## 2014-05-02 DIAGNOSIS — Z961 Presence of intraocular lens: Secondary | ICD-10-CM | POA: Diagnosis not present

## 2014-06-16 DIAGNOSIS — R51 Headache: Secondary | ICD-10-CM | POA: Diagnosis not present

## 2014-06-16 DIAGNOSIS — E780 Pure hypercholesterolemia: Secondary | ICD-10-CM | POA: Diagnosis not present

## 2014-06-16 DIAGNOSIS — I10 Essential (primary) hypertension: Secondary | ICD-10-CM | POA: Diagnosis not present

## 2014-06-16 DIAGNOSIS — N182 Chronic kidney disease, stage 2 (mild): Secondary | ICD-10-CM | POA: Diagnosis not present

## 2014-06-25 DIAGNOSIS — R599 Enlarged lymph nodes, unspecified: Secondary | ICD-10-CM | POA: Diagnosis not present

## 2014-06-25 DIAGNOSIS — K047 Periapical abscess without sinus: Secondary | ICD-10-CM | POA: Diagnosis not present

## 2014-09-22 ENCOUNTER — Other Ambulatory Visit: Payer: Self-pay

## 2014-09-30 DIAGNOSIS — M170 Bilateral primary osteoarthritis of knee: Secondary | ICD-10-CM | POA: Diagnosis not present

## 2014-12-09 DIAGNOSIS — Z1231 Encounter for screening mammogram for malignant neoplasm of breast: Secondary | ICD-10-CM | POA: Diagnosis not present

## 2014-12-11 ENCOUNTER — Encounter: Payer: Self-pay | Admitting: Gynecology

## 2014-12-18 DIAGNOSIS — M170 Bilateral primary osteoarthritis of knee: Secondary | ICD-10-CM | POA: Diagnosis not present

## 2014-12-18 DIAGNOSIS — Z4789 Encounter for other orthopedic aftercare: Secondary | ICD-10-CM | POA: Diagnosis not present

## 2014-12-19 DIAGNOSIS — Z23 Encounter for immunization: Secondary | ICD-10-CM | POA: Diagnosis not present

## 2014-12-19 DIAGNOSIS — R51 Headache: Secondary | ICD-10-CM | POA: Diagnosis not present

## 2014-12-19 DIAGNOSIS — Z1389 Encounter for screening for other disorder: Secondary | ICD-10-CM | POA: Diagnosis not present

## 2014-12-19 DIAGNOSIS — N182 Chronic kidney disease, stage 2 (mild): Secondary | ICD-10-CM | POA: Diagnosis not present

## 2014-12-19 DIAGNOSIS — Z6834 Body mass index (BMI) 34.0-34.9, adult: Secondary | ICD-10-CM | POA: Diagnosis not present

## 2014-12-19 DIAGNOSIS — E669 Obesity, unspecified: Secondary | ICD-10-CM | POA: Diagnosis not present

## 2014-12-19 DIAGNOSIS — F418 Other specified anxiety disorders: Secondary | ICD-10-CM | POA: Diagnosis not present

## 2014-12-19 DIAGNOSIS — Z0000 Encounter for general adult medical examination without abnormal findings: Secondary | ICD-10-CM | POA: Diagnosis not present

## 2014-12-19 DIAGNOSIS — M1990 Unspecified osteoarthritis, unspecified site: Secondary | ICD-10-CM | POA: Diagnosis not present

## 2014-12-19 DIAGNOSIS — E780 Pure hypercholesterolemia: Secondary | ICD-10-CM | POA: Diagnosis not present

## 2014-12-19 DIAGNOSIS — I10 Essential (primary) hypertension: Secondary | ICD-10-CM | POA: Diagnosis not present

## 2015-02-13 ENCOUNTER — Encounter: Payer: Self-pay | Admitting: Gynecology

## 2015-02-13 ENCOUNTER — Ambulatory Visit (INDEPENDENT_AMBULATORY_CARE_PROVIDER_SITE_OTHER): Payer: Medicare Other | Admitting: Gynecology

## 2015-02-13 VITALS — BP 118/76 | Ht 64.5 in | Wt 204.0 lb

## 2015-02-13 DIAGNOSIS — Z8041 Family history of malignant neoplasm of ovary: Secondary | ICD-10-CM | POA: Diagnosis not present

## 2015-02-13 DIAGNOSIS — Z1273 Encounter for screening for malignant neoplasm of ovary: Secondary | ICD-10-CM | POA: Diagnosis not present

## 2015-02-13 DIAGNOSIS — Z01419 Encounter for gynecological examination (general) (routine) without abnormal findings: Secondary | ICD-10-CM | POA: Diagnosis not present

## 2015-02-13 DIAGNOSIS — Z780 Asymptomatic menopausal state: Secondary | ICD-10-CM

## 2015-02-13 NOTE — Progress Notes (Signed)
Elizabeth Castro May 30, 1945 [id number]   History:    69 y.o.  for annual gyn exam with no complaints today. Patient's mother had history of ovarian cancer. We are doing annual ultrasound screens along with CA 125. Patient with several small uterine fibroids which are been stable throughout the years.Her primary physician is Dr. Deforest Hoyles who has been doing her lab work in following her for her hypercholesterolemia and hypertension. Patient many years ago had benign left breast biopsy. Patient on no hormone replacement therapy. Patient had normal bone density study in 2014. Patient's vaccines are up-to-date. Patient with normal colonoscopy 2012.  Past medical history,surgical history, family history and social history were all reviewed and documented in the EPIC chart.  Gynecologic History No LMP recorded. Patient is postmenopausal. Contraception: post menopausal status Last Pap: 2015. Results were: normal Last mammogram: 2016. Results were: normal  Obstetric History OB History  Gravida Para Term Preterm AB SAB TAB Ectopic Multiple Living  2 2        2     # Outcome Date GA Lbr Len/2nd Weight Sex Delivery Anes PTL Lv  2 Para     F Vag-Spont     1 Para     F Vag-Spont          ROS: A ROS was performed and pertinent positives and negatives are included in the history.  GENERAL: No fevers or chills. HEENT: No change in vision, no earache, sore throat or sinus congestion. NECK: No pain or stiffness. CARDIOVASCULAR: No chest pain or pressure. No palpitations. PULMONARY: No shortness of breath, cough or wheeze. GASTROINTESTINAL: No abdominal pain, nausea, vomiting or diarrhea, melena or bright red blood per rectum. GENITOURINARY: No urinary frequency, urgency, hesitancy or dysuria. MUSCULOSKELETAL: No joint or muscle pain, no back pain, no recent trauma. DERMATOLOGIC: No rash, no itching, no lesions. ENDOCRINE: No polyuria, polydipsia, no heat or cold intolerance. No recent change in weight.  HEMATOLOGICAL: No anemia or easy bruising or bleeding. NEUROLOGIC: No headache, seizures, numbness, tingling or weakness. PSYCHIATRIC: No depression, no loss of interest in normal activity or change in sleep pattern.     Exam: chaperone present  BP 118/76 mmHg  Ht 5' 4.5" (1.638 m)  Wt 204 lb (92.534 kg)  BMI 34.49 kg/m2  Body mass index is 34.49 kg/(m^2).  General appearance : Well developed well nourished female. No acute distress HEENT: Eyes: no retinal hemorrhage or exudates,  Neck supple, trachea midline, no carotid bruits, no thyroidmegaly Lungs: Clear to auscultation, no rhonchi or wheezes, or rib retractions  Heart: Regular rate and rhythm, no murmurs or gallops Breast:Examined in sitting and supine position were symmetrical in appearance, no palpable masses or tenderness,  no skin retraction, no nipple inversion, no nipple discharge, no skin discoloration, no axillary or supraclavicular lymphadenopathy Abdomen: no palpable masses or tenderness, no rebound or guarding Extremities: no edema or skin discoloration or tenderness  Pelvic:  Bartholin, Urethra, Skene Glands: Within normal limits             Vagina: No gross lesions or discharge  Cervix: No gross lesions or discharge  Uterus  anteverted, normal size, shape and consistency, non-tender and mobile  Adnexa  Without masses or tenderness  Anus and perineum  normal   Rectovaginal  normal sphincter tone without palpated masses or tenderness             Hemoccult PCP will provide     Assessment/Plan:  69 y.o. female for annual exam postmenopausal no  hormone replacement therapy doing well. PCP has been doing her blood work. Vaccines are up-to-date. As a result of her mother's history of ovarian cancer for surveillance we are doing an annual CA-125 for which we'll draw her blood today. She will return back to the office next month or so far her ultrasound for follow-up of her history of fibroid uterus and to better assess her  ovaries and she is overweight.   Terrance Mass MD, 12:04 PM 02/13/2015

## 2015-02-13 NOTE — Addendum Note (Signed)
Addended by: Terrance Mass on: 02/13/2015 12:14 PM   Modules accepted: Orders

## 2015-02-14 LAB — CA 125: CA 125: 4 U/mL (ref <35)

## 2015-03-13 DIAGNOSIS — M170 Bilateral primary osteoarthritis of knee: Secondary | ICD-10-CM | POA: Diagnosis not present

## 2015-03-19 ENCOUNTER — Other Ambulatory Visit: Payer: Self-pay | Admitting: Gynecology

## 2015-03-19 ENCOUNTER — Ambulatory Visit (INDEPENDENT_AMBULATORY_CARE_PROVIDER_SITE_OTHER): Payer: Medicare Other

## 2015-03-19 DIAGNOSIS — Z780 Asymptomatic menopausal state: Secondary | ICD-10-CM

## 2015-03-19 DIAGNOSIS — M8580 Other specified disorders of bone density and structure, unspecified site: Secondary | ICD-10-CM

## 2015-03-19 DIAGNOSIS — M899 Disorder of bone, unspecified: Secondary | ICD-10-CM

## 2015-03-19 DIAGNOSIS — M8589 Other specified disorders of bone density and structure, multiple sites: Secondary | ICD-10-CM | POA: Diagnosis not present

## 2015-04-08 ENCOUNTER — Ambulatory Visit (INDEPENDENT_AMBULATORY_CARE_PROVIDER_SITE_OTHER): Payer: Medicare Other | Admitting: Gynecology

## 2015-04-08 ENCOUNTER — Other Ambulatory Visit: Payer: Self-pay | Admitting: Gynecology

## 2015-04-08 ENCOUNTER — Encounter: Payer: Self-pay | Admitting: Gynecology

## 2015-04-08 ENCOUNTER — Ambulatory Visit (INDEPENDENT_AMBULATORY_CARE_PROVIDER_SITE_OTHER): Payer: Medicare Other

## 2015-04-08 VITALS — BP 128/76

## 2015-04-08 DIAGNOSIS — Z8041 Family history of malignant neoplasm of ovary: Secondary | ICD-10-CM

## 2015-04-08 DIAGNOSIS — M8580 Other specified disorders of bone density and structure, unspecified site: Secondary | ICD-10-CM | POA: Diagnosis not present

## 2015-04-08 DIAGNOSIS — D251 Intramural leiomyoma of uterus: Secondary | ICD-10-CM

## 2015-04-08 NOTE — Progress Notes (Signed)
   Patient is a 70 year old was seen the office on November 18 for her annual exam. She is here today to discuss her ultrasound that we're performing yearly to monitor her fibroid uterus as well as ovarian cancer screening since her mother had ovarian cancer. Patient recently had a CA 125 as well which was normal with a value of 4. Patient's recent bone density study had demonstrated evidence of decreased bone mineralization (osteopenia) lowest T score total left hip -1.2. There were statistically significant decrease in bone mineralization of the left femoral neck of -12.9% and -9.5% of the right femoral neck. No significant change in the AP spine. Patient is on calcium and vitamin D.  Ultrasound from 2016: Uterus measures 6.4 x 5.3 x 4.0 cm within meter stripe of 2 mm. Patient with 2 small fibroids the largest one measuring 21 x 14 mm. Right and left ovary were otherwise normal and atrophic. No adnexal masses and no fluid in the colon sac  C1 25 with a value of 3   Ultrasound today: Uterus measures 6.7 x 5.0 x 3.6 cm with endometrial stripe at 2.4 mm. Patient with 4 small fibroids largest 1 22 x 23 mm. Essentially unchanged from last year. Right ovary atrophic located posterior wall of the uterus. Left ovary was normal. No fluid in the cul-de-sac and no adnexal masses.  CA 125 with a value of 4  Assessment/plan: #1 osteopenia continue calcium and vitamin D and weightbearing exercises. Next bone density study in 2018 #2 fibroid uterus stable unchanged her several years. Normal-appearing ovaries normal CA 125. #3 annual exam and of 2017.

## 2015-04-28 DIAGNOSIS — M170 Bilateral primary osteoarthritis of knee: Secondary | ICD-10-CM | POA: Diagnosis not present

## 2015-05-06 DIAGNOSIS — M170 Bilateral primary osteoarthritis of knee: Secondary | ICD-10-CM | POA: Diagnosis not present

## 2015-05-13 DIAGNOSIS — M170 Bilateral primary osteoarthritis of knee: Secondary | ICD-10-CM | POA: Diagnosis not present

## 2015-06-18 DIAGNOSIS — R51 Headache: Secondary | ICD-10-CM | POA: Diagnosis not present

## 2015-06-18 DIAGNOSIS — E7800 Pure hypercholesterolemia, unspecified: Secondary | ICD-10-CM | POA: Diagnosis not present

## 2015-06-18 DIAGNOSIS — M1990 Unspecified osteoarthritis, unspecified site: Secondary | ICD-10-CM | POA: Diagnosis not present

## 2015-06-18 DIAGNOSIS — R079 Chest pain, unspecified: Secondary | ICD-10-CM | POA: Diagnosis not present

## 2015-06-18 DIAGNOSIS — N182 Chronic kidney disease, stage 2 (mild): Secondary | ICD-10-CM | POA: Diagnosis not present

## 2015-06-18 DIAGNOSIS — Z6834 Body mass index (BMI) 34.0-34.9, adult: Secondary | ICD-10-CM | POA: Diagnosis not present

## 2015-06-18 DIAGNOSIS — I10 Essential (primary) hypertension: Secondary | ICD-10-CM | POA: Diagnosis not present

## 2015-06-18 DIAGNOSIS — E669 Obesity, unspecified: Secondary | ICD-10-CM | POA: Diagnosis not present

## 2015-06-23 ENCOUNTER — Other Ambulatory Visit (HOSPITAL_COMMUNITY): Payer: Self-pay | Admitting: Internal Medicine

## 2015-06-23 DIAGNOSIS — R079 Chest pain, unspecified: Secondary | ICD-10-CM

## 2015-06-23 DIAGNOSIS — I10 Essential (primary) hypertension: Secondary | ICD-10-CM

## 2015-06-25 ENCOUNTER — Ambulatory Visit (HOSPITAL_COMMUNITY): Payer: Medicare Other | Attending: Cardiovascular Disease

## 2015-06-25 DIAGNOSIS — I10 Essential (primary) hypertension: Secondary | ICD-10-CM | POA: Diagnosis not present

## 2015-06-25 DIAGNOSIS — R079 Chest pain, unspecified: Secondary | ICD-10-CM

## 2015-06-25 LAB — MYOCARDIAL PERFUSION IMAGING
CHL CUP NUCLEAR SRS: 0
CHL CUP RESTING HR STRESS: 59 {beats}/min
CSEPPHR: 83 {beats}/min
LV dias vol: 83 mL (ref 46–106)
LVSYSVOL: 31 mL
NUC STRESS TID: 0.91
RATE: 0.31
SDS: 1
SSS: 1

## 2015-06-25 MED ORDER — TECHNETIUM TC 99M SESTAMIBI GENERIC - CARDIOLITE
11.0000 | Freq: Once | INTRAVENOUS | Status: AC | PRN
  Administered 2015-06-25: 11 via INTRAVENOUS

## 2015-06-25 MED ORDER — REGADENOSON 0.4 MG/5ML IV SOLN
0.4000 mg | Freq: Once | INTRAVENOUS | Status: AC
  Administered 2015-06-25: 0.4 mg via INTRAVENOUS

## 2015-06-25 MED ORDER — TECHNETIUM TC 99M SESTAMIBI GENERIC - CARDIOLITE
32.5000 | Freq: Once | INTRAVENOUS | Status: AC | PRN
  Administered 2015-06-25: 33 via INTRAVENOUS

## 2015-08-07 DIAGNOSIS — M170 Bilateral primary osteoarthritis of knee: Secondary | ICD-10-CM | POA: Diagnosis not present

## 2015-10-21 DIAGNOSIS — L603 Nail dystrophy: Secondary | ICD-10-CM | POA: Diagnosis not present

## 2015-10-21 DIAGNOSIS — M545 Low back pain: Secondary | ICD-10-CM | POA: Diagnosis not present

## 2015-10-22 DIAGNOSIS — L603 Nail dystrophy: Secondary | ICD-10-CM | POA: Diagnosis not present

## 2015-10-22 DIAGNOSIS — M6740 Ganglion, unspecified site: Secondary | ICD-10-CM | POA: Diagnosis not present

## 2015-10-29 DIAGNOSIS — S39012A Strain of muscle, fascia and tendon of lower back, initial encounter: Secondary | ICD-10-CM | POA: Diagnosis not present

## 2015-12-23 DIAGNOSIS — I10 Essential (primary) hypertension: Secondary | ICD-10-CM | POA: Diagnosis not present

## 2015-12-23 DIAGNOSIS — Z23 Encounter for immunization: Secondary | ICD-10-CM | POA: Diagnosis not present

## 2015-12-23 DIAGNOSIS — Z0000 Encounter for general adult medical examination without abnormal findings: Secondary | ICD-10-CM | POA: Diagnosis not present

## 2015-12-23 DIAGNOSIS — Z1389 Encounter for screening for other disorder: Secondary | ICD-10-CM | POA: Diagnosis not present

## 2015-12-23 DIAGNOSIS — R7309 Other abnormal glucose: Secondary | ICD-10-CM | POA: Diagnosis not present

## 2015-12-23 DIAGNOSIS — E559 Vitamin D deficiency, unspecified: Secondary | ICD-10-CM | POA: Diagnosis not present

## 2015-12-23 DIAGNOSIS — E7800 Pure hypercholesterolemia, unspecified: Secondary | ICD-10-CM | POA: Diagnosis not present

## 2015-12-23 DIAGNOSIS — N182 Chronic kidney disease, stage 2 (mild): Secondary | ICD-10-CM | POA: Diagnosis not present

## 2015-12-23 DIAGNOSIS — M1990 Unspecified osteoarthritis, unspecified site: Secondary | ICD-10-CM | POA: Diagnosis not present

## 2015-12-30 DIAGNOSIS — M170 Bilateral primary osteoarthritis of knee: Secondary | ICD-10-CM | POA: Diagnosis not present

## 2016-01-06 DIAGNOSIS — M170 Bilateral primary osteoarthritis of knee: Secondary | ICD-10-CM | POA: Diagnosis not present

## 2016-01-13 DIAGNOSIS — M170 Bilateral primary osteoarthritis of knee: Secondary | ICD-10-CM | POA: Diagnosis not present

## 2016-02-08 ENCOUNTER — Encounter: Payer: Self-pay | Admitting: Gynecology

## 2016-02-08 DIAGNOSIS — Z1231 Encounter for screening mammogram for malignant neoplasm of breast: Secondary | ICD-10-CM | POA: Diagnosis not present

## 2016-02-15 ENCOUNTER — Encounter: Payer: Self-pay | Admitting: Gynecology

## 2016-02-15 ENCOUNTER — Ambulatory Visit (INDEPENDENT_AMBULATORY_CARE_PROVIDER_SITE_OTHER): Payer: Medicare Other | Admitting: Gynecology

## 2016-02-15 VITALS — BP 126/78 | Ht 64.5 in | Wt 185.6 lb

## 2016-02-15 DIAGNOSIS — Z8041 Family history of malignant neoplasm of ovary: Secondary | ICD-10-CM | POA: Diagnosis not present

## 2016-02-15 DIAGNOSIS — N83202 Unspecified ovarian cyst, left side: Secondary | ICD-10-CM | POA: Diagnosis not present

## 2016-02-15 DIAGNOSIS — D251 Intramural leiomyoma of uterus: Secondary | ICD-10-CM

## 2016-02-15 DIAGNOSIS — Z01419 Encounter for gynecological examination (general) (routine) without abnormal findings: Secondary | ICD-10-CM | POA: Diagnosis not present

## 2016-02-15 DIAGNOSIS — M8580 Other specified disorders of bone density and structure, unspecified site: Secondary | ICD-10-CM | POA: Insufficient documentation

## 2016-02-15 NOTE — Progress Notes (Signed)
Elizabeth Castro April 30, 1945 [id number]   History:    70 y.o.  for annual gyn exam with no complaints today. Patient has been followed yearly with ultrasounds as a result of her history of fibroid uterus and mother with history of ovarian cancer. She has had stable fibroid and normal CA 125. Patient's last colonoscopy was normal in 2012. Her PCP has been doing her blood work and her vaccines are up-to-date. Patient patient's bone density study in 2016 demonstrated the following: Decreased bone mineralization (osteopenia)lowest T score total left hip -1.2. There were statistically significant decrease in bone mineralization of the left femoral neck of -12.9% and -9.5% of the right femoral neck. No significant change in the AP spine. Patient is on calcium and vitamin D.   Ultrasound 2016: Uterus measures 6.4 x 5.3 x 4.0 cm within meter stripe of 2 mm. Patient with 2 small fibroids the largest one measuring 21 x 14 mm. Right and left ovary were otherwise normal and atrophic. No adnexal masses and no fluid in the colon sac  C1 25 with a value of 3  Ultrasound  January 2017: Uterus measures 6.7 x 5.0 x 3.6 cm with endometrial stripe at 2.4 mm. Patient with 4 small fibroids largest 1 22 x 23 mm. Essentially unchanged from last year. Right ovary atrophic located posterior wall of the uterus. Left ovary was normal. No fluid in the cul-de-sac and no adnexal masses.  CA 125 with a value of 4  Past medical history,surgical history, family history and social history were all reviewed and documented in the EPIC chart.  Gynecologic History No LMP recorded. Patient is postmenopausal. Contraception: post menopausal status Last Pap: 2015. Results were: normal Last mammogram: 2017. Results were: normal  Obstetric History OB History  Gravida Para Term Preterm AB Living  2 2       2   SAB TAB Ectopic Multiple Live Births               # Outcome Date GA Lbr Len/2nd Weight Sex Delivery Anes PTL Lv  2  Para     F Vag-Spont     1 Para     F Vag-Spont          ROS: A ROS was performed and pertinent positives and negatives are included in the history.  GENERAL: No fevers or chills. HEENT: No change in vision, no earache, sore throat or sinus congestion. NECK: No pain or stiffness. CARDIOVASCULAR: No chest pain or pressure. No palpitations. PULMONARY: No shortness of breath, cough or wheeze. GASTROINTESTINAL: No abdominal pain, nausea, vomiting or diarrhea, melena or bright red blood per rectum. GENITOURINARY: No urinary frequency, urgency, hesitancy or dysuria. MUSCULOSKELETAL: No joint or muscle pain, no back pain, no recent trauma. DERMATOLOGIC: No rash, no itching, no lesions. ENDOCRINE: No polyuria, polydipsia, no heat or cold intolerance. No recent change in weight. HEMATOLOGICAL: No anemia or easy bruising or bleeding. NEUROLOGIC: No headache, seizures, numbness, tingling or weakness. PSYCHIATRIC: No depression, no loss of interest in normal activity or change in sleep pattern.     Exam: chaperone present  BP 126/78   Ht 5' 4.5" (1.638 m)   Wt 185 lb 9.6 oz (84.2 kg)   BMI 31.37 kg/m   Body mass index is 31.37 kg/m.  General appearance : Well developed well nourished female. No acute distress HEENT: Eyes: no retinal hemorrhage or exudates,  Neck supple, trachea midline, no carotid bruits, no thyroidmegaly Lungs: Clear to auscultation, no rhonchi or  wheezes, or rib retractions  Heart: Regular rate and rhythm, no murmurs or gallops Breast:Examined in sitting and supine position were symmetrical in appearance, no palpable masses or tenderness,  no skin retraction, no nipple inversion, no nipple discharge, no skin discoloration, no axillary or supraclavicular lymphadenopathy Abdomen: no palpable masses or tenderness, no rebound or guarding Extremities: no edema or skin discoloration or tenderness  Pelvic:  Bartholin, Urethra, Skene Glands: Within normal limits             Vagina: No  gross lesions or discharge  Cervix: No gross lesions or discharge  Uterus  anteverted, normal size, shape and consistency, non-tender and mobile  Adnexa  Without masses or tenderness  Anus and perineum  normal   Rectovaginal  normal sphincter tone without palpated masses or tenderness             Hemoccult PCP provides     Assessment/Plan:  70 y.o. female for annual exam to schedule her annual follow-up ultrasound in January 2018 to monitor her fibroid as well as for screening for ovarian cancer because of her family history (mother). We will check her CA 125 today. PCP has been doing the rest of her blood work and her vaccines are up-to-date. She will need her next bone density study in 2018. We discussed importance of calcium vitamin D and weightbearing exercises for osteoporosis prevention. Pap smear not indicated according to the new guidelines.   Terrance Mass MD, 1:09 PM 02/15/2016

## 2016-02-16 LAB — CA 125: CA 125: 4 U/mL (ref <35)

## 2016-03-02 ENCOUNTER — Encounter: Payer: Self-pay | Admitting: Gynecology

## 2016-03-02 ENCOUNTER — Ambulatory Visit (INDEPENDENT_AMBULATORY_CARE_PROVIDER_SITE_OTHER): Payer: Medicare Other

## 2016-03-02 ENCOUNTER — Other Ambulatory Visit: Payer: Self-pay | Admitting: Gynecology

## 2016-03-02 ENCOUNTER — Ambulatory Visit (INDEPENDENT_AMBULATORY_CARE_PROVIDER_SITE_OTHER): Payer: Medicare Other | Admitting: Gynecology

## 2016-03-02 VITALS — BP 124/70

## 2016-03-02 DIAGNOSIS — N839 Noninflammatory disorder of ovary, fallopian tube and broad ligament, unspecified: Secondary | ICD-10-CM

## 2016-03-02 DIAGNOSIS — Z8041 Family history of malignant neoplasm of ovary: Secondary | ICD-10-CM

## 2016-03-02 DIAGNOSIS — D251 Intramural leiomyoma of uterus: Secondary | ICD-10-CM | POA: Diagnosis not present

## 2016-03-02 DIAGNOSIS — N83202 Unspecified ovarian cyst, left side: Secondary | ICD-10-CM

## 2016-03-02 DIAGNOSIS — N838 Other noninflammatory disorders of ovary, fallopian tube and broad ligament: Secondary | ICD-10-CM

## 2016-03-02 NOTE — Progress Notes (Signed)
   Patient is a 70 year old who was seen in the office for annual exam on November 20. Patient has been followed with annual ultrasounds a result of history of fibroid uterus and mother with history of ovarian cancer. She has been having annual CA-125 which of been normal and her ultrasound demonstrated the fibroids were small and unchanged. She was asymptomatic today here to discuss the results.  Ultrasound 2016: Uterus measures 6.4 x 5.3 x 4.0 cm within meter stripe of 2 mm. Patient with 2 small fibroids the largest one measuring 21 x 14 mm. Right and left ovary were otherwise normal and atrophic. No adnexal masses and no fluid in the colon sac  C1 25 with a value of 3  Ultrasound  January 2017: Uterus measures 6.7 x 5.0 x 3.6 cm with endometrial stripe at 2.4 mm. Patient with 4 small fibroids largest 1 22 x 23 mm. Essentially unchanged from last year. Right ovary atrophic located posterior wall of the uterus. Left ovary was normal. No fluid in the cul-de-sac and no adnexal masses.  CA 125 with a value of 4  Ultrasound December 2017: Uterus measures 6.3 x 5.2 x 3.2 cm within reach or strep a 2.4 mm. Several calcified fibroids as follows: 13 x 8 mm, 12 x 11 mm, 11 x 10 mm, 19 x 15 mm, 14 x 12 mm. Right ovary was normal. Left ovary a heterogeneous echo pattern echogenic focal area measuring 17 x 1 mm with positive color flow to the area and no fluid in the cul-de-sac. Endometrial stripe 2.4 mm.  CA 125 with a value of 4  Assessment/plan: Patient postmenopausal with stable small fibroid uterus. Thin endometrial stripe. Mother with history of ovarian cancer. New finding was small heterogeneous echo pattern area measuring 17 x 11 mm the left ovary with positive color Doppler. She had a normal CA 125 with a value of 4 last month. This could be a small calcified area for close surveillance she'll return back to the office for an ultrasound in 6 months if still present or changes in size impaction is  worried we could potentially because of her mother's history of ovarian cancer proceed with a laparoscopic bilateral salpingo-oophorectomy as an outpatient procedure. Literature information was provided.  Greater than 90% time was spent counseling quantity care for this patient. Time of consultation 15 minutes

## 2016-03-02 NOTE — Patient Instructions (Signed)
Diagnostic Laparoscopy A diagnostic laparoscopy is a procedure to diagnose diseases in the abdomen. During the procedure, a thin, lighted, pencil-sized instrument called a laparoscope is inserted into the abdomen through an incision. The laparoscope allows your health care provider to look at the organs inside your body. Tell a health care provider about:  Any allergies you have.  All medicines you are taking, including vitamins, herbs, eye drops, creams, and over-the-counter medicines.  Any problems you or family members have had with anesthetic medicines.  Any blood disorders you have.  Any surgeries you have had.  Any medical conditions you have. What are the risks? Generally, this is a safe procedure. However, problems can occur, which may include:  Infection.  Bleeding.  Damage to other organs.  Allergic reaction to the anesthetics used during the procedure. What happens before the procedure?  Do not eat or drink anything after midnight on the night before the procedure or as directed by your health care provider.  Ask your health care provider about:  Changing or stopping your regular medicines.  Taking medicines such as aspirin and ibuprofen. These medicines can thin your blood. Do not take these medicines before your procedure if your health care provider instructs you not to.  Plan to have someone take you home after the procedure. What happens during the procedure?  You may be given a medicine to help you relax (sedative).  You will be given a medicine to make you sleep (general anesthetic).  Your abdomen will be inflated with a gas. This will make your organs easier to see.  Small incisions will be made in your abdomen.  A laparoscope and other small instruments will be inserted into the abdomen through the incisions.  A tissue sample may be removed from an organ in the abdomen for examination.  The instruments will be removed from the abdomen.  The gas  will be released.  The incisions will be closed with stitches (sutures). What happens after the procedure? Your blood pressure, heart rate, breathing rate, and blood oxygen level will be monitored often until the medicines you were given have worn off. This information is not intended to replace advice given to you by your health care provider. Make sure you discuss any questions you have with your health care provider. Document Released: 06/20/2000 Document Revised: 07/23/2015 Document Reviewed: 10/25/2013 Elsevier Interactive Patient Education  2017 [employer]. Ovarian Cyst  An ovarian cyst is a fluid-filled sac that forms on an ovary. The ovaries are small organs that produce eggs in women. Various types of cysts can form on the ovaries. Some may cause symptoms and require treatment. Most ovarian cysts go away on their own, are not cancerous (are benign), and do not cause problems. Common types of ovarian cysts include:  Functional (follicle) cysts.  Occur during the menstrual cycle, and usually go away with the next menstrual cycle if you do not get pregnant.  Usually cause no symptoms.  Endometriomas.  Are cysts that form from the tissue that lines the uterus (endometrium).  Are sometimes called "chocolate cysts" because they become filled with blood that turns brown.  Can cause pain in the lower abdomen during intercourse and during your period.  Cystadenoma cysts.  Develop from cells on the outside surface of the ovary.  Can get very large and cause lower abdomen pain and pain with intercourse.  Can cause severe pain if they twist or break open (rupture).  Dermoid cysts.  Are sometimes found in both ovaries.  May contain different kinds of body tissue, such as skin, teeth, hair, or cartilage.  Usually do not cause symptoms unless they get very big.  Theca lutein cysts.  Occur when too much of a certain hormone (human chorionic gonadotropin) is produced and  overstimulates the ovaries to produce an egg.  Are most common after having procedures used to assist with the conception of a baby (in vitro fertilization). What are the causes? Ovarian cysts may be caused by:  Ovarian hyperstimulation syndrome. This is a condition that can develop from taking fertility medicines. It causes multiple large ovarian cysts to form.  Polycystic ovarian syndrome (PCOS). This is a common hormonal disorder that can cause ovarian cysts, as well as problems with your period or fertility. What increases the risk? The following factors may make you more likely to develop ovarian cysts:  Being overweight or obese.  Taking fertility medicines.  Taking certain forms of hormonal birth control.  Smoking. What are the signs or symptoms? Many ovarian cysts do not cause symptoms. If symptoms are present, they may include:  Pelvic pain or pressure.  Pain in the lower abdomen.  Pain during sex.  Abdominal swelling.  Abnormal menstrual periods.  Increasing pain with menstrual periods. How is this diagnosed? These cysts are commonly found during a routine pelvic exam. You may have tests to find out more about the cyst, such as:  Ultrasound.  X-ray of the pelvis.  CT scan.  MRI.  Blood tests. How is this treated? Many ovarian cysts go away on their own without treatment. Your health care provider may want to check your cyst regularly for 2-3 months to see if it changes. If you are in menopause, it is especially important to have your cyst monitored closely because menopausal women have a higher rate of ovarian cancer. When treatment is needed, it may include:  Medicines to help relieve pain.  A procedure to drain the cyst (aspiration).  Surgery to remove the whole cyst.  Hormone treatment or birth control pills. These methods are sometimes used to help dissolve a cyst. Follow these instructions at home:  Take over-the-counter and prescription  medicines only as told by your health care provider.  Do not drive or use heavy machinery while taking prescription pain medicine.  Get regular pelvic exams and Pap tests as often as told by your health care provider.  Return to your normal activities as told by your health care provider. Ask your health care provider what activities are safe for you.  Do not use any products that contain nicotine or tobacco, such as cigarettes and e-cigarettes. If you need help quitting, ask your health care provider.  Keep all follow-up visits as told by your health care provider. This is important. Contact a health care provider if:  Your periods are late, irregular, or painful, or they stop.  You have pelvic pain that does not go away.  You have pressure on your bladder or trouble emptying your bladder completely.  You have pain during sex.  You have any of the following in your abdomen:  A feeling of fullness.  Pressure.  Discomfort.  Pain that does not go away.  Swelling.  You feel generally ill.  You become constipated.  You lose your appetite.  You develop severe acne.  You start to have more body hair and facial hair.  You are gaining weight or losing weight without changing your exercise and eating habits.  You think you may be pregnant. Get help  right away if:  You have abdominal pain that is severe or gets worse.  You cannot eat or drink without vomiting.  You suddenly develop a fever.  Your menstrual period is much heavier than usual. This information is not intended to replace advice given to you by your health care provider. Make sure you discuss any questions you have with your health care provider. Document Released: 03/14/2005 Document Revised: 10/02/2015 Document Reviewed: 08/16/2015 Elsevier Interactive Patient Education  2017 [employer].

## 2016-03-03 ENCOUNTER — Other Ambulatory Visit: Payer: Self-pay | Admitting: *Deleted

## 2016-03-03 DIAGNOSIS — N83202 Unspecified ovarian cyst, left side: Secondary | ICD-10-CM

## 2016-06-22 DIAGNOSIS — I10 Essential (primary) hypertension: Secondary | ICD-10-CM | POA: Diagnosis not present

## 2016-06-22 DIAGNOSIS — E7800 Pure hypercholesterolemia, unspecified: Secondary | ICD-10-CM | POA: Diagnosis not present

## 2016-06-22 DIAGNOSIS — E559 Vitamin D deficiency, unspecified: Secondary | ICD-10-CM | POA: Diagnosis not present

## 2016-06-22 DIAGNOSIS — N182 Chronic kidney disease, stage 2 (mild): Secondary | ICD-10-CM | POA: Diagnosis not present

## 2016-06-22 DIAGNOSIS — M1990 Unspecified osteoarthritis, unspecified site: Secondary | ICD-10-CM | POA: Diagnosis not present

## 2016-06-29 ENCOUNTER — Encounter: Payer: Self-pay | Admitting: Gynecology

## 2016-06-29 ENCOUNTER — Ambulatory Visit (INDEPENDENT_AMBULATORY_CARE_PROVIDER_SITE_OTHER): Payer: Medicare Other | Admitting: Gynecology

## 2016-06-29 ENCOUNTER — Ambulatory Visit (INDEPENDENT_AMBULATORY_CARE_PROVIDER_SITE_OTHER): Payer: Medicare Other

## 2016-06-29 VITALS — BP 110/68 | Ht 65.0 in | Wt 175.0 lb

## 2016-06-29 DIAGNOSIS — N83202 Unspecified ovarian cyst, left side: Secondary | ICD-10-CM

## 2016-06-29 DIAGNOSIS — Z8041 Family history of malignant neoplasm of ovary: Secondary | ICD-10-CM | POA: Diagnosis not present

## 2016-06-29 NOTE — Progress Notes (Signed)
   Patient is a 71 year old that presented to the office for her 6 months follow-up ultrasound as a result at the time of her annual exam since she is being screen because of mother's history with ovarian cancer there appeared to be on the last scan a heterogeneous echo pattern echogenic focal area on the right ovary measures 17 x 1 mm which had positive color flow to the area and no fluid in the cul-de-sac at that time. She is asymptomatic on no hormone replacement therapy and had a normal CA 125.  Her ultrasounds have demonstrated the following: Ultrasound 2016: Uterus measures 6.4 x 5.3 x 4.0 cm within meter stripe of 2 mm. Patient with 2 small fibroids the largest one measuring 21 x 14 mm. Right and left ovary were otherwise normal and atrophic. No adnexal masses and no fluid in the colon sac  C1 25 with a value of 3  Ultrasound January 2017: Uterus measures 6.7 x 5.0 x 3.6 cm with endometrial stripe at 2.4 mm. Patient with 4 small fibroids largest 1 22 x 23 mm. Essentially unchanged from last year. Right ovary atrophic located posterior wall of the uterus. Left ovary was normal. No fluid in the cul-de-sac and no adnexal masses.  CA 125 with a value of 4  Ultrasound December 2017: Uterus measures 6.3 x 5.2 x 3.2 cm within reach or strep a 2.4 mm. Several calcified fibroids as follows: 13 x 8 mm, 12 x 11 mm, 11 x 10 mm, 19 x 15 mm, 14 x 12 mm. Right ovary was normal. Left ovary a heterogeneous echo pattern echogenic focal area measuring 17 x 1 mm with positive color flow to the area and no fluid in the cul-de-sac. Endometrial stripe 2.4 mm.  CA 125 with a value of 4   Ultrasound April 2018: Uterus measured 5.9 x 4.9 x 3.0 cm endometrial stripe 1.5 mm. No change in fibroids. Right ovary normal located posterior to the uterine wall. Left ovary continued presence of heterogeneous focal area measuring 15 x 11 x 13 mm no significant change. Blood flow to the left ovary was seen but not to  the focal area. No fluid in the cul-de-sac.  Assessment/plan: Patient with small focal area noted over the course the past 6 months on the left ovary. Essentially unchanged normal CA 125 probably calcified albicans? Patient was offered laparoscopic bilateral salpingo-oophorectomy because her family history of ovarian cancer but due to her new work she would like to wait to later in the year and will return back in December for annual exam as well as ultrasound. We will repeat the CA 125 today. Literature information was provided.

## 2016-06-29 NOTE — Patient Instructions (Signed)
CA-125 Tumor Marker Test Why am I having this test? This test is used to check the level of cancer antigen 125 (CA-125) in your blood. The CA-125 tumor marker test can be helpful in detecting ovarian cancer. The test is only performed if you are considered at high risk for ovarian cancer. Your health care provider may recommend this test if:  You have a strong family history of ovarian cancer.  You have a breast cancer antigen (BRCA) genetic defect. If you have already been diagnosed with ovarian cancer, your health care provider may use this test to help identify the extent of the disease and to monitor your response to treatment. What kind of sample is taken? A blood sample is required for this test. It is usually collected by inserting a needle into a vein. How do I prepare for this test? There is no preparation required for this test. What are the reference ranges? Reference ranges are considered healthy ranges established after testing a large group of healthy people. Reference ranges may vary among different people, labs, and hospitals. It is your responsibility to obtain your test results. Ask the lab or department performing the test when and how you will get your results. The reference range for this test is 0-35 units/mL or less than 35 kunits/L (SI units). What do the results mean? Increased levels of CA-125 may indicate:  Certain types of cancer, including:  Ovarian cancer.  Pancreatic cancer.  Colon cancer.  Lung cancer.  Breast cancer.  Lymphoma.  Noncancerous (benign) disorders, including:  Cirrhosis.  Pregnancy.  Endometriosis.  Pancreatitis.  Pelvic inflammatory disease (PID). Talk with your health care provider to discuss your results, treatment options, and if necessary, the need for more tests. Talk with your health care provider if you have any questions about your results. Talk with your health care provider to discuss your results, treatment options,  and if necessary, the need for more tests. Talk with your health care provider if you have any questions about your results. This information is not intended to replace advice given to you by your health care provider. Make sure you discuss any questions you have with your health care provider. Document Released: 04/05/2004 Document Revised: 11/17/2015 Document Reviewed: 08/01/2013 Elsevier Interactive Patient Education  2017 [employer]. Ovarian Cyst  An ovarian cyst is a fluid-filled sac that forms on an ovary. The ovaries are small organs that produce eggs in women. Various types of cysts can form on the ovaries. Some may cause symptoms and require treatment. Most ovarian cysts go away on their own, are not cancerous (are benign), and do not cause problems. Common types of ovarian cysts include:  Functional (follicle) cysts.  Occur during the menstrual cycle, and usually go away with the next menstrual cycle if you do not get pregnant.  Usually cause no symptoms.  Endometriomas.  Are cysts that form from the tissue that lines the uterus (endometrium).  Are sometimes called "chocolate cysts" because they become filled with blood that turns brown.  Can cause pain in the lower abdomen during intercourse and during your period.  Cystadenoma cysts.  Develop from cells on the outside surface of the ovary.  Can get very large and cause lower abdomen pain and pain with intercourse.  Can cause severe pain if they twist or break open (rupture).  Dermoid cysts.  Are sometimes found in both ovaries.  May contain different kinds of body tissue, such as skin, teeth, hair, or cartilage.  Usually do not  cause symptoms unless they get very big.  Theca lutein cysts.  Occur when too much of a certain hormone (human chorionic gonadotropin) is produced and overstimulates the ovaries to produce an egg.  Are most common after having procedures used to assist with the conception of a baby (in  vitro fertilization). What are the causes? Ovarian cysts may be caused by:  Ovarian hyperstimulation syndrome. This is a condition that can develop from taking fertility medicines. It causes multiple large ovarian cysts to form.  Polycystic ovarian syndrome (PCOS). This is a common hormonal disorder that can cause ovarian cysts, as well as problems with your period or fertility. What increases the risk? The following factors may make you more likely to develop ovarian cysts:  Being overweight or obese.  Taking fertility medicines.  Taking certain forms of hormonal birth control.  Smoking. What are the signs or symptoms? Many ovarian cysts do not cause symptoms. If symptoms are present, they may include:  Pelvic pain or pressure.  Pain in the lower abdomen.  Pain during sex.  Abdominal swelling.  Abnormal menstrual periods.  Increasing pain with menstrual periods. How is this diagnosed? These cysts are commonly found during a routine pelvic exam. You may have tests to find out more about the cyst, such as:  Ultrasound.  X-ray of the pelvis.  CT scan.  MRI.  Blood tests. How is this treated? Many ovarian cysts go away on their own without treatment. Your health care provider may want to check your cyst regularly for 2-3 months to see if it changes. If you are in menopause, it is especially important to have your cyst monitored closely because menopausal women have a higher rate of ovarian cancer. When treatment is needed, it may include:  Medicines to help relieve pain.  A procedure to drain the cyst (aspiration).  Surgery to remove the whole cyst.  Hormone treatment or birth control pills. These methods are sometimes used to help dissolve a cyst. Follow these instructions at home:  Take over-the-counter and prescription medicines only as told by your health care provider.  Do not drive or use heavy machinery while taking prescription pain medicine.  Get  regular pelvic exams and Pap tests as often as told by your health care provider.  Return to your normal activities as told by your health care provider. Ask your health care provider what activities are safe for you.  Do not use any products that contain nicotine or tobacco, such as cigarettes and e-cigarettes. If you need help quitting, ask your health care provider.  Keep all follow-up visits as told by your health care provider. This is important. Contact a health care provider if:  Your periods are late, irregular, or painful, or they stop.  You have pelvic pain that does not go away.  You have pressure on your bladder or trouble emptying your bladder completely.  You have pain during sex.  You have any of the following in your abdomen:  A feeling of fullness.  Pressure.  Discomfort.  Pain that does not go away.  Swelling.  You feel generally ill.  You become constipated.  You lose your appetite.  You develop severe acne.  You start to have more body hair and facial hair.  You are gaining weight or losing weight without changing your exercise and eating habits.  You think you may be pregnant. Get help right away if:  You have abdominal pain that is severe or gets worse.  You cannot eat  or drink without vomiting.  You suddenly develop a fever.  Your menstrual period is much heavier than usual. This information is not intended to replace advice given to you by your health care provider. Make sure you discuss any questions you have with your health care provider. Document Released: 03/14/2005 Document Revised: 10/02/2015 Document Reviewed: 08/16/2015 Elsevier Interactive Patient Education  2017 [employer].

## 2016-06-30 LAB — CA 125: CA 125: 4 U/mL (ref <35)

## 2016-08-10 ENCOUNTER — Encounter: Payer: Self-pay | Admitting: Gynecology

## 2016-08-31 ENCOUNTER — Other Ambulatory Visit: Payer: Self-pay

## 2016-08-31 DIAGNOSIS — I83813 Varicose veins of bilateral lower extremities with pain: Secondary | ICD-10-CM

## 2016-09-29 DIAGNOSIS — M170 Bilateral primary osteoarthritis of knee: Secondary | ICD-10-CM | POA: Diagnosis not present

## 2016-09-29 DIAGNOSIS — M1711 Unilateral primary osteoarthritis, right knee: Secondary | ICD-10-CM | POA: Diagnosis not present

## 2016-09-29 DIAGNOSIS — M1712 Unilateral primary osteoarthritis, left knee: Secondary | ICD-10-CM | POA: Diagnosis not present

## 2016-10-06 DIAGNOSIS — M170 Bilateral primary osteoarthritis of knee: Secondary | ICD-10-CM | POA: Diagnosis not present

## 2016-10-13 DIAGNOSIS — M170 Bilateral primary osteoarthritis of knee: Secondary | ICD-10-CM | POA: Diagnosis not present

## 2016-10-17 ENCOUNTER — Encounter: Payer: Self-pay | Admitting: Vascular Surgery

## 2016-10-28 ENCOUNTER — Ambulatory Visit (INDEPENDENT_AMBULATORY_CARE_PROVIDER_SITE_OTHER): Payer: Medicare Other | Admitting: Vascular Surgery

## 2016-10-28 ENCOUNTER — Ambulatory Visit (HOSPITAL_COMMUNITY)
Admission: RE | Admit: 2016-10-28 | Discharge: 2016-10-28 | Disposition: A | Discharge status: Home / Self Care | Payer: Medicare Other | Source: Ambulatory Visit | Attending: Vascular Surgery | Admitting: Vascular Surgery

## 2016-10-28 ENCOUNTER — Encounter: Payer: Self-pay | Admitting: Vascular Surgery

## 2016-10-28 VITALS — BP 123/74 | HR 61 | Temp 97.1°F | Resp 18 | Ht 65.0 in | Wt 164.0 lb

## 2016-10-28 DIAGNOSIS — I83813 Varicose veins of bilateral lower extremities with pain: Secondary | ICD-10-CM

## 2016-10-28 DIAGNOSIS — L97909 Non-pressure chronic ulcer of unspecified part of unspecified lower leg with unspecified severity: Secondary | ICD-10-CM

## 2016-10-28 DIAGNOSIS — I83009 Varicose veins of unspecified lower extremity with ulcer of unspecified site: Secondary | ICD-10-CM | POA: Diagnosis not present

## 2016-10-28 NOTE — Progress Notes (Signed)
Patient ID: Elizabeth Castro, female   DOB: Nov 28, 1945, 71 y.o.   MRN: [id number]  Reason for Consult: New Evaluation (New VV Bil Le venous)   Referred by Wenda Low, MD  Subjective:     HPI:  Elizabeth Castro is a 71 y.o. female without real significant past medical history. 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 She also has some spider veins in the thighs. She has never had a history of a blood clot. She knows that these likely. After childbirth and she does not have other family members with significant varicosities. She said they previously bothering her more prior to the weight loss of greater than 30 pounds in the past year. She currently works out. She has minimal leg swelling does not have other complaints related to today's visit.  Past Medical History:  Diagnosis Date  . High cholesterol   . Hypertension    Family History  Problem Relation Age of Onset  . Cancer Mother        OVARIAN   Past Surgical History:  Procedure Laterality Date  . BREAST SURGERY     LEFT BREAST BIOPSY  . COLONOSCOPY W/ POLYPECTOMY  2002   BENIGN  . ENDOMETRIAL ABLATION  1997   RESECTOSCOPIC POLYPECTOMY  . TUBAL LIGATION  1977    Short Social History:  Social History  Substance Use Topics  . Smoking status: Never Smoker  . Smokeless tobacco: Never Used  . Alcohol use No    No Known Allergies  Current Outpatient Prescriptions  Medication Sig Dispense Refill  . calcium carbonate (OS-CAL) 600 MG TABS Take 600 mg by mouth 2 (two) times daily with a meal.      . cholecalciferol (VITAMIN D) 1000 UNITS tablet Take 1,000 Units by mouth daily.    . simvastatin (ZOCOR) 40 MG tablet Take 40 mg by mouth at bedtime.      . hydrochlorothiazide (HYDRODIURIL) 25 MG tablet Take 25 mg by mouth daily.       No current facility-administered medications for this visit.     Review of Systems  Constitutional:  Constitutional  negative. Cardiovascular: Positive for leg swelling.  GI: Gastrointestinal negative.  Musculoskeletal: Positive for joint pain.  Skin: Skin negative.  Neurological: Neurological negative. Hematologic: Hematologic/lymphatic negative.  Psychiatric: Psychiatric negative.        Objective:  Objective   Vitals:   10/28/16 1253  BP: 123/74  Pulse: 61  Resp: 18  Temp: (!) 97.1 F (36.2 C)  TempSrc: Oral  SpO2: 100%  Weight: 164 lb (74.4 kg)  Height: 5\' 5"  (1.651 m)   Body mass index is 27.29 kg/m.  Physical Exam  Constitutional: She is oriented to person, place, and time. She appears well-developed.  HENT:  Head: Normocephalic.  Eyes: Pupils are equal, round, and reactive to light.  Neck: Normal range of motion.  Cardiovascular: Normal rate.   Pulses:      Dorsalis pedis pulses are 2+ on the right side, and 2+ on the left side.       Posterior tibial pulses are 2+ on the right side, and 2+ on the left side.  Pulmonary/Chest: Effort normal.  Abdominal: Soft. She exhibits no mass.  Musculoskeletal:  Minimal spider veins bilateral thighs Area of varicosities on left leg  Lymphadenopathy:    She has no cervical adenopathy.  Neurological: She is oriented to person, place, and time.  Skin: Skin is warm.  Psychiatric: She has a normal mood and affect. Her behavior is normal. Judgment normal.    Data: I have independently interpreted her venous reflux evaluation demonstrating left common femoral and popliteal vein as well as saphenofemoral junction and proximal saphenous vein and proximal mid calf reflux. The right side has saphenofemoral junction reflux only.     Assessment/Plan:     71 year old female here for evaluation of varicosities in her left leg. These cause her minimal discomfort at this time and she does not have any significant saphenous vein reflux for treatment. She does have minimal leg swelling we discussed compression stockings. This time she does not want  to have intervention on these as long as they are not dangerous. We will give her Liz's card today and she can call for follow-up on an as-needed basis.     Waynetta Sandy MD [facility]

## 2016-11-03 ENCOUNTER — Ambulatory Visit: Payer: Medicare Other | Admitting: Obstetrics & Gynecology

## 2016-11-04 ENCOUNTER — Encounter: Payer: Self-pay | Admitting: Obstetrics & Gynecology

## 2016-11-04 ENCOUNTER — Ambulatory Visit (INDEPENDENT_AMBULATORY_CARE_PROVIDER_SITE_OTHER): Payer: Medicare Other | Admitting: Obstetrics & Gynecology

## 2016-11-04 VITALS — BP 138/70

## 2016-11-04 DIAGNOSIS — R1031 Right lower quadrant pain: Secondary | ICD-10-CM

## 2016-11-04 DIAGNOSIS — N83202 Unspecified ovarian cyst, left side: Secondary | ICD-10-CM

## 2016-11-04 NOTE — Patient Instructions (Signed)
1. Cyst of left ovary Small heterogeneous focal area on [place] 06/2016 measured at 1.5 cm stable (measured 1.7 cm 02/2016) with Ca 125 normal at 4 on April 06/2016.  Will reevaluate with Ca125 today and repeat Pelvic US. - CA 125 - US Transvaginal Non-OB; Future  2. Right lower quadrant abdominal pain Normal Gyn exam, but given the persistence of pain, the Lt Ovarian cyst/focus and her mother with a h/o Ovarian Ca, decision to repeat Ca 125 today and f/u for Pelvic US. - CA 125 - US Transvaginal Non-OB; Future  Nessie, it was a pleasure to meet you today!  I will inform you of your results as soon as available and see you soon again for the Pelvic US.

## 2016-11-04 NOTE — Progress Notes (Signed)
    Elizabeth Castro 02/06/1946 [id number]        71 y.o.  G2P2   RP:  Rt Pelvic Pain x 4 weeks  HPI:  Right pelvic pain, crampy, nagging, present most of the time x 4 weeks.  No pain medication taken.  No PMB.  No vaginal d/c.  BMs normal.  No UTI Sx.  No fever, no N/V, no decrease in appetitie, no recent change in wt (lost 34 Lbs x last year voluntarily with increased physical activity and good nutrition).  Mildly swollen legs unchanged per patient.  Past medical history,surgical history, problem list, medications, allergies, family history and social history were all reviewed and documented in the EPIC chart.  Directed ROS with pertinent positives and negatives documented in the history of present illness/assessment and plan.  Exam:  Vitals:   11/04/16 1359  BP: 138/70   General appearance:  Normal  Gyn exam:  Vulva normal                     Bimanual exam:  Uterus AV, normal volume, mobile, NT.  No adnexal mass felt, NT.  Ultrasound April 2018: Uterus measured 5.9 x 4.9 x 3.0 cm endometrial stripe 1.5 mm. No change in fibroids. Right ovary normal located posterior to the uterine wall. Left ovary continued presence of heterogeneous focal area measuring 15 x 11 x 13 mm no significant change. Blood flow to the left ovary was seen but not to the focal area. No fluid in the cul-de-sac.  Ca125 normal at 4 on April 4th 2018.  Assessment/Plan:  71 y.o. G2P2   1. Cyst of left ovary Small heterogeneous focal area on [place] 06/2016 measured at 1.5 cm stable (measured 1.7 cm 02/2016) with Ca 125 normal at 4 on April 06/2016.  Will reevaluate with Ca125 today and repeat Pelvic US. - CA 125 - US Transvaginal Non-OB; Future  2. Right lower quadrant abdominal pain Normal Gyn exam, but given the persistence of pain, the Lt Ovarian cyst/focus and her mother with a h/o Ovarian Ca, decision to repeat Ca 125 today and f/u for Pelvic US. - CA 125 - US Transvaginal Non-OB; Future  Counseling on above  issues >50% x 15 minutes  Princess Bruins MD, 2:09 PM 11/04/2016

## 2016-11-07 LAB — CA 125: CA 125: 4 U/mL (ref <35)

## 2016-11-21 ENCOUNTER — Encounter: Payer: Self-pay | Admitting: Obstetrics & Gynecology

## 2016-11-21 ENCOUNTER — Ambulatory Visit (INDEPENDENT_AMBULATORY_CARE_PROVIDER_SITE_OTHER): Payer: Medicare Other | Admitting: Obstetrics & Gynecology

## 2016-11-21 ENCOUNTER — Ambulatory Visit (INDEPENDENT_AMBULATORY_CARE_PROVIDER_SITE_OTHER): Payer: Medicare Other

## 2016-11-21 VITALS — BP 128/86

## 2016-11-21 DIAGNOSIS — R1031 Right lower quadrant pain: Secondary | ICD-10-CM | POA: Diagnosis not present

## 2016-11-21 DIAGNOSIS — R102 Pelvic and perineal pain: Secondary | ICD-10-CM

## 2016-11-21 DIAGNOSIS — N83202 Unspecified ovarian cyst, left side: Secondary | ICD-10-CM

## 2016-11-21 DIAGNOSIS — Z8041 Family history of malignant neoplasm of ovary: Secondary | ICD-10-CM | POA: Diagnosis not present

## 2016-11-21 NOTE — Patient Instructions (Signed)
1. Right Pelvic pain in female Not likely of Gyn origin, normal Rt Ovary.  Increased gas in bowels.  Due for Colonoscopy, will proceed and discuss symptoms with Gastro-Enterologist.  2. Cyst of left ovary Decreased in size and negative CFD.  Ca125 neg at 4 on 11/04/2016.  Very likely benign.  Reassured.  But given her positive Fam H/O Ovarian Ca in mother, decision to refer for Genetic counseling/screening.  Patient considering LPS BSO.  3. Family history of ovarian cancer Patient adopted, but recently informed that natural mother died of Ovarian Ca.  Refer to [facility] [facility] for [employer] testing.  Patient considering LPS BSO.  Elizabeth Castro, it was good to see you today!  I sent the request for the referral with a genetic counselor.  After completing that evaluation, we can meet again to decide if whether or not we should proceed with removal of your Ovaries and Tubes.

## 2016-11-21 NOTE — Progress Notes (Signed)
    Elizabeth Castro Nov 11, 1945 [id number]        71 y.o.  G2P2  Adopted  RP:  F/U Lt Ovarian lesion, Rt Pelvic pain for Pelvic US  HPI:  Rt pelvic discomfort persists.  No PMB.  Followed for a small Lt Ovarian lesion which was stable in 06/2016.  Ca 125 still at 4 on 11/04/2016.  Informed that natural Mother died of Ovarian Cancer.  Past medical history,surgical history, problem list, medications, allergies, family history and social history were all reviewed and documented in the EPIC chart.  Directed ROS with pertinent positives and negatives documented in the history of present illness/assessment and plan.  Exam:  Vitals:   11/21/16 1226  BP: 128/86   General appearance:  Normal  Pelvic US today:  T/V Anteverted Uterus 6.2 x 5.4 x 3.2 cm, no change in intramural fibroids compared with [place] 06/2016.  Right Ovary normal.  Left Ovary with heterogeneous echogenic focal area measuring 0.9 x 0.9 cm.  Negative CFD.  Decreased in size since [place] 06/2016 when it was measured at 1.5 cm.  No FF in CDS.  Ca125 still negative at 4 on 11/04/2016.  Assessment/Plan:  71 y.o. G2P2   1. Right Pelvic pain in female Not likely of Gyn origin, normal Rt Ovary.  Increased gas in bowels.  Due for Colonoscopy, will proceed and discuss symptoms with Gastro-Enterologist.  2. Cyst of left ovary Decreased in size and negative CFD.  Ca125 neg at 4 on 11/04/2016.  Very likely benign.  Reassured.  But given her positive Fam H/O Ovarian Ca in mother, decision to refer for Genetic counseling/screening.  Patient considering LPS BSO.  3. Family history of ovarian cancer Patient adopted, but recently informed that natural mother died of Ovarian Ca.  Refer to [facility] [facility] for [employer] testing.  Patient considering LPS BSO.  Counseling on above issues >50% x 15 minutes.  Princess Bruins MD, 12:37 PM 11/21/2016

## 2016-11-22 ENCOUNTER — Telehealth: Payer: Self-pay | Admitting: *Deleted

## 2016-11-22 DIAGNOSIS — Z8041 Family history of malignant neoplasm of ovary: Secondary | ICD-10-CM

## 2016-11-22 NOTE — Telephone Encounter (Signed)
Referral placed at [facility] they will contact pt to schedule.

## 2016-11-22 NOTE — Telephone Encounter (Signed)
-----   Message from Princess Bruins, MD sent at 11/21/2016  1:08 PM EDT ----- Regarding: Refer for Genetic Counseling/Screening Adopted, but informed that natural mother died of Ovarian Cancer.  Refer for genetic counseling and Genetic testing.

## 2016-12-12 ENCOUNTER — Encounter: Payer: Self-pay | Admitting: Genetics

## 2016-12-12 ENCOUNTER — Telehealth: Payer: Self-pay | Admitting: Genetics

## 2016-12-12 NOTE — Telephone Encounter (Signed)
Genetic counseling appt has been scheduled for the pt to see Ria Comment on 10/8 at 11am. Pt aware to arrive 30 minutes early. Letter mailed.

## 2016-12-13 NOTE — Telephone Encounter (Signed)
Appointment on 01/22/17

## 2016-12-29 DIAGNOSIS — Z23 Encounter for immunization: Secondary | ICD-10-CM | POA: Diagnosis not present

## 2016-12-29 DIAGNOSIS — M1990 Unspecified osteoarthritis, unspecified site: Secondary | ICD-10-CM | POA: Diagnosis not present

## 2016-12-29 DIAGNOSIS — R7301 Impaired fasting glucose: Secondary | ICD-10-CM | POA: Diagnosis not present

## 2016-12-29 DIAGNOSIS — Z1389 Encounter for screening for other disorder: Secondary | ICD-10-CM | POA: Diagnosis not present

## 2016-12-29 DIAGNOSIS — Z0000 Encounter for general adult medical examination without abnormal findings: Secondary | ICD-10-CM | POA: Diagnosis not present

## 2016-12-29 DIAGNOSIS — Z7189 Other specified counseling: Secondary | ICD-10-CM | POA: Diagnosis not present

## 2016-12-29 DIAGNOSIS — N182 Chronic kidney disease, stage 2 (mild): Secondary | ICD-10-CM | POA: Diagnosis not present

## 2016-12-29 DIAGNOSIS — I10 Essential (primary) hypertension: Secondary | ICD-10-CM | POA: Diagnosis not present

## 2016-12-29 DIAGNOSIS — E7800 Pure hypercholesterolemia, unspecified: Secondary | ICD-10-CM | POA: Diagnosis not present

## 2016-12-29 DIAGNOSIS — R51 Headache: Secondary | ICD-10-CM | POA: Diagnosis not present

## 2017-01-02 ENCOUNTER — Encounter: Payer: Self-pay | Admitting: Genetics

## 2017-01-02 ENCOUNTER — Ambulatory Visit (HOSPITAL_BASED_OUTPATIENT_CLINIC_OR_DEPARTMENT_OTHER): Payer: Medicare Other | Admitting: Genetics

## 2017-01-02 ENCOUNTER — Other Ambulatory Visit: Payer: Medicare Other

## 2017-01-02 DIAGNOSIS — Z8041 Family history of malignant neoplasm of ovary: Secondary | ICD-10-CM

## 2017-01-02 DIAGNOSIS — Z7183 Encounter for nonprocreative genetic counseling: Secondary | ICD-10-CM

## 2017-01-02 NOTE — Progress Notes (Signed)
REFERRING PROVIDER: Princess Bruins, MD [address] [place], [place] [address]  PRIMARY PROVIDER:  Wenda Low, MD  PRIMARY REASON FOR VISIT:  1. Family history of ovarian cancer      HISTORY OF PRESENT ILLNESS:   Ms. Munroe, a 71 y.o. female, was seen for a [facility] cancer genetics consultation at the request of Dr. Dellis Filbert due to a family history of ovarian cancer.  Ms. Tones presents to clinic today to discuss the possibility of a hereditary predisposition to cancer, genetic testing, and to further clarify her future cancer risks, as well as potential cancer risks for family members.    Ms. Regnier is a 71 y.o. female with no personal history of cancer. She has been doing ovarian cancer screening (ultrasound and CA-125).  She is currently considering having a BSO.  HORMONAL RISK FACTORS:  Menarche was at age 27.  First live birth at age 47.  Ovaries intact: yes.  Hysterectomy: no.  Menopausal status: postmenopausal.  HRT use: 0 years. Colonoscopy: yes; some polyps, every 10 eyras. Number of breast biopsies: 0.  Past Medical History:  Diagnosis Date  . High cholesterol   . Hypertension     Past Surgical History:  Procedure Laterality Date  . BREAST SURGERY     LEFT BREAST BIOPSY  . COLONOSCOPY W/ POLYPECTOMY  2002   BENIGN  . ENDOMETRIAL ABLATION  1997   RESECTOSCOPIC POLYPECTOMY  . TUBAL LIGATION  1977    Social History   Social History  . Marital status: Married    Spouse name: N/A  . Number of children: N/A  . Years of education: N/A   Social History Main Topics  . Smoking status: Never Smoker  . Smokeless tobacco: Never Used  . Alcohol use No  . Drug use: No  . Sexual activity: Not Currently    Birth control/ protection: Post-menopausal   Other Topics Concern  . None   Social History Narrative  . None     FAMILY HISTORY:  We obtained a detailed, 4-generation family history.  Significant diagnoses are listed  below: Family History  Problem Relation Age of Onset  . Ovarian cancer Mother 39   Ms. Cranfield has 2 daughters ages 14 and 72 with no history of cancer.  Her youngest daughter has 2 sons (ages 45 and 35) and her oldest daughter has 1 daughter (age 26) with no history of cancer.   Ms. Ohalloran is adopted and therefore has limited information about her family.  She has 2 brothers.  One who is deceased at 29 and one who is 90.  No known history of cancer for these relatives.    Ms. Wengert mother was diagnosed with ovarian cancer at 26 and died in her 42's.  Ms. Gibbins reports that she has some maternal uncles, but does not know any information about them.  Ms. Krajewski does not have any information about her maternal grandparents.   Ms. Almgren has no information about her father and paternal family history.   Ms. Loudenslager is unaware of previous family history of genetic testing for hereditary cancer risks. Patient's maternal ancestors are of African American descent, and paternal ancestors are of African American descent. There is no reported Ashkenazi Jewish ancestry. There is no known consanguinity.  GENETIC COUNSELING ASSESSMENT: Seva Chancy is a 71 y.o. female with a family history which is somewhat suggestive of a Hereditary Cancer Predisposition Syndrome. We, therefore, discussed and recommended the following at today's visit.  DISCUSSION: We reviewed the characteristics, features and inheritance patterns of hereditary cancer syndromes. We also discussed genetic testing, including the appropriate family members to test, the process of testing, insurance coverage and turn-around-time for results. We discussed the implications of a negative, positive and/or variant of uncertain significant result. We recommended Ms. Fay pursue genetic testing for the Multi-Cancer gene panel. The Multi-Cancer Panel offered by [facility] includes sequencing and/or deletion duplication testing of the  following 83 genes: ALK, APC, ATM, AXIN2,BAP1,  BARD1, BLM, BMPR1A, BRCA1, BRCA2, BRIP1, CASR, CDC73, CDH1, CDK4, CDKN1B, CDKN1C, CDKN2A (p14ARF), CDKN2A (p16INK4a), CEBPA, CHEK2, CTNNA1, DICER1, DIS3L2, EGFR (c.2369C>T, p.Thr790Met variant only), EPCAM (Deletion/duplication testing only), FH, FLCN, GATA2, GPC3, GREM1 (Promoter region deletion/duplication testing only), HOXB13 (c.251G>A, p.Gly84Glu), HRAS, KIT, MAX, MEN1, MET, MITF (c.952G>A, p.Glu318Lys variant only), MLH1, MSH2, MSH3, MSH6, MUTYH, NBN, NF1, NF2, NTHL1, PALB2, PDGFRA, PHOX2B, PMS2, POLD1, POLE, POT1, PRKAR1A, PTCH1, PTEN, RAD50, RAD51C, RAD51D, RB1, RECQL4, RET, RUNX1, SDHAF2, SDHA (sequence changes only), SDHB, SDHC, SDHD, SMAD4, SMARCA4, SMARCB1, SMARCE1, STK11, SUFU, TERC, TERT, TMEM127, TP53, TSC1, TSC2, VHL, WRN and WT1.   We discussed that only 5-10% of cancers are associated with a Hereditary cancer predisposition syndrome.  One of the most common hereditary cancer syndromes that increases ovarian cancer risk is called Hereditary Breast and Ovarian Cancer (HBOC) syndrome.  This syndrome is caused by mutations in the BRCA1 and BRCA2 genes.  This syndrome increases an individual's lifetime risk to develop breast, ovarian, pancreatic, and other types of cancer.  There are also many other cancer predisposition syndromes caused by mutations in several other genes.  We discussed that if she is found to have a mutation in one of these genes, it may impact future medical management recommendations such as increased cancer screenings and consideration of risk reducing surgeries.  A positive result could also have implications for the patient's family members.  A Negative result would mean we were unable to identify a hereditary component to her cancer, but does not rule out the possibility of a hereditary basis for her mother's cancer.  There could be mutations that are undetectable by current technology, or in genes not yet tested or  identified to increase cancer risk.    We discussed the potential to find a Variant of Uncertain Significance or VUS.  These are variants that have not yet been identified as pathogenic or benign, and it is unknown if this variant is associated with increased cancer risk or if this is a normal finding.  Most VUS's are reclassified to benign or likely benign.   It should not be used to make medical management decisions. With time, we suspect the lab will determine the significance of any VUS's identified if any.   Based on Ms. Carolan's family history of cancer, she meets medical criteria for genetic testing. Despite that she meets criteria, she may still have an out of pocket cost. We discussed that if her out of pocket cost for testing is over $100, the laboratory will call and confirm whether she wants to proceed with testing.  If the out of pocket cost of testing is less than $100 she will be billed by the genetic testing laboratory.   We discussed that some people do not want to undergo genetic testing due to fear of genetic discrimination.  A federal law called the Genetic Information Non-Discrimination Act (GINA) of 2008 helps protect individuals against genetic discrimination based on their genetic test results.  It impacts both health insurance and employment.  For health insurance, it protects against increased premiums, being kicked off insurance or being forced to take a test in order to be insured.  For employment it protects against hiring, firing and promoting decisions based on genetic test results.  Health status due to a cancer diagnosis is not protected under GINA.  This law does not protect life insurance, disability insurance, or other types of insurance.   PLAN: After considering the risks, benefits, and limitations, Ms. Isip  provided informed consent to pursue genetic testing and the blood sample was sent to [facility] for analysis of the Multi-Cancer Panel. Results should  be available within approximately 2-3 weeks' time, at which point they will be disclosed by telephone to Ms. Howerton, as will any additional recommendations warranted by these results. Ms. Levitan will receive a summary of her genetic counseling visit and a copy of her results once available. This information will also be available in Epic. We encouraged Ms. Gulick to remain in contact with cancer genetics annually so that we can continuously update the family history and inform her of any changes in cancer genetics and testing that may be of benefit for her family. Ms. Lafevers questions were answered to her satisfaction today. Our contact information was provided should additional questions or concerns arise.  Based on Ms. Chacko's family history, we recommended her other maternal relatives, have genetic counseling and testing. Ms. Sherrard will let us know if we can be of any assistance in coordinating genetic counseling and/or testing for this family member.   Lastly, we encouraged Ms. Schexnider to remain in contact with cancer genetics annually so that we can continuously update the family history and inform her of any changes in cancer genetics and testing that may be of benefit for this family.   Ms.  Lodato questions were answered to her satisfaction today. Our contact information was provided should additional questions or concerns arise. Thank you for the referral and allowing [place] to share in the care of your patient.   Tana Felts, MS Genetic Counselor [email or web address] phone: [phone]  The patient was seen for a total of 40 minutes in face-to-face genetic counseling.

## 2017-01-05 ENCOUNTER — Other Ambulatory Visit: Payer: Self-pay | Admitting: Internal Medicine

## 2017-01-05 ENCOUNTER — Ambulatory Visit
Admission: RE | Admit: 2017-01-05 | Discharge: 2017-01-05 | Disposition: A | Discharge status: Home / Self Care | Payer: Medicare Other | Source: Ambulatory Visit | Attending: Internal Medicine | Admitting: Internal Medicine

## 2017-01-05 DIAGNOSIS — S8991XA Unspecified injury of right lower leg, initial encounter: Secondary | ICD-10-CM | POA: Diagnosis not present

## 2017-01-05 DIAGNOSIS — M25561 Pain in right knee: Secondary | ICD-10-CM

## 2017-01-05 DIAGNOSIS — M25511 Pain in right shoulder: Secondary | ICD-10-CM | POA: Diagnosis not present

## 2017-01-05 DIAGNOSIS — W19XXXA Unspecified fall, initial encounter: Secondary | ICD-10-CM | POA: Diagnosis not present

## 2017-01-25 ENCOUNTER — Telehealth: Payer: Self-pay | Admitting: Genetics

## 2017-01-25 ENCOUNTER — Encounter: Payer: Self-pay | Admitting: Genetics

## 2017-01-25 NOTE — Telephone Encounter (Signed)
Revealed negative genetic testing.  Revealed that  VUS's in BLM, CASR, MET, and VHL were  identified.   This normal result is reassuring and indicates that it is unlikely Elizabeth Castro is at an increased risk to develop cancer associated with these genes. However, genetic testing is not perfect, and cannot definitively rule out a hereditary cause.  It will be important for her to keep in contact with genetics to learn if any additional testing may be needed in the future.

## 2017-01-26 ENCOUNTER — Encounter: Payer: Self-pay | Admitting: Genetics

## 2017-01-26 ENCOUNTER — Ambulatory Visit: Payer: Self-pay | Admitting: Genetics

## 2017-01-26 DIAGNOSIS — Z8041 Family history of malignant neoplasm of ovary: Secondary | ICD-10-CM

## 2017-01-26 DIAGNOSIS — Z1379 Encounter for other screening for genetic and chromosomal anomalies: Secondary | ICD-10-CM

## 2017-01-26 NOTE — Progress Notes (Signed)
HPI: Elizabeth Castro was previously seen in the [facility] on 01/02/2017 due to a famiy history of ovarian cancer and concerns regarding a hereditary predisposition to cancer. Please refer to our prior cancer genetics clinic note for more information regarding Elizabeth Castro's medical, social and family histories, and our assessment and recommendations, at the time. Elizabeth Castro recent genetic test results were disclosed to her, as well as recommendations warranted by these results. These results and recommendations are discussed in more detail below.  FAMILY HISTORY:  We obtained a detailed, 4-generation family history.  Significant diagnoses are listed below: Family History  Problem Relation Age of Onset  . Ovarian cancer Mother 22   Elizabeth Castro has 2 daughters ages 87 and 13 with no history of cancer.  Her youngest daughter has 2 sons (ages 72 and 46) and her oldest daughter has 1 daughter (age 21) with no history of cancer.   Elizabeth Castro is adopted and therefore has limited information about her family.  She has 2 brothers.  One who is deceased at 8 and one who is 1.  No known history of cancer for these relatives.    Elizabeth Castro mother was diagnosed with ovarian cancer at 50 and died in her 8's.  Elizabeth Castro reports that she has some maternal uncles, but does not know any information about them.  Elizabeth Castro does not have any information about her maternal grandparents.   Elizabeth Castro has no information about her father and paternal family history.   Elizabeth Castro is unaware of previous family history of genetic testing for hereditary cancer risks. Patient's maternal ancestors are of African American descent, and paternal ancestors are of African American descent. There is no reported Ashkenazi Jewish ancestry. There is no known consanguinity.  GENETIC TEST RESULTS: Genetic testing performed through [facility]'s Multi-Cancer Panel reported out on 01/19/2017 showed no  pathogenic mutations.   The Multi-Cancer Panel offered by [facility] includes sequencing and/or deletion duplication testing of the following 83 genes: ALK, APC, ATM, AXIN2,BAP1,  BARD1, BLM, BMPR1A, BRCA1, BRCA2, BRIP1, CASR, CDC73, CDH1, CDK4, CDKN1B, CDKN1C, CDKN2A (p14ARF), CDKN2A (p16INK4a), CEBPA, CHEK2, CTNNA1, DICER1, DIS3L2, EGFR (c.2369C>T, p.Thr790Met variant only), EPCAM (Deletion/duplication testing only), FH, FLCN, GATA2, GPC3, GREM1 (Promoter region deletion/duplication testing only), HOXB13 (c.251G>A, p.Gly84Glu), HRAS, KIT, MAX, MEN1, MET, MITF (c.952G>A, p.Glu318Lys variant only), MLH1, MSH2, MSH3, MSH6, MUTYH, NBN, NF1, NF2, NTHL1, PALB2, PDGFRA, PHOX2B, PMS2, POLD1, POLE, POT1, PRKAR1A, PTCH1, PTEN, RAD50, RAD51C, RAD51D, RB1, RECQL4, RET, RUNX1, SDHAF2, SDHA (sequence changes only), SDHB, SDHC, SDHD, SMAD4, SMARCA4, SMARCB1, SMARCE1, STK11, SUFU, TERC, TERT, TMEM127, TP53, TSC1, TSC2, VHL, WRN and WT1. .  A variant of uncertain significance (VUS) in a gene called BLM was also noted. c.191A>T (p.Asp64Val) A variant of uncertain significance (VUS) in a gene called CASR was also noted. c.2082C>T (Silent) A variant of uncertain significance (VUS) in a gene called MET was also noted. c.1232C>T (p.Ala411Val) A variant of uncertain significance (VUS) in a gene called VHL was also noted. c.167C>G (p.Ala56Gly)  The test report will be scanned into EPIC and will be located under the Molecular Pathology section of the Results Review tab.A portion of the result report is included below for reference.      We discussed with Elizabeth Castro that because current genetic testing is not perfect, it is possible there may be a gene mutation in one of these genes that current testing cannot detect, but that chance is small. We also discussed, that there could  be another gene that has not yet been discovered, or that we have not yet tested, that is responsible for the cancer diagnoses in the family. I is  also possible that there is a hereditary cause for Elizabeth Castro's family history of cancer that she did not inherit and therefore was not identified in her. Therefore, it is important to remain in touch with cancer genetics in the future so that we can continue to offer Ms. Ayo the most up to date genetic testing.   Regarding the VUS's in BLM, CASR, MET, and VHL: At this time, it is unknown if these variants are associated with increased cancer risk or if they are normal findings, but most variants such as these get reclassified to being inconsequential. They should not be used to make medical management decisions. With time, we suspect the lab will determine the significance of these variants, if any. If we do learn more about them, we will try to contact Elizabeth Castro to discuss it further. However, it is important to stay in touch with [place] periodically and keep the address and phone number up to date.  ADDITIONAL GENETIC TESTING: We discussed with Elizabeth Castro that her genetic testing was fairly extensive.  If there are are genes identified to increase cancer risk that can be analyzed in the future, we would be happy to discuss and coordinate this testing at that time.       CANCER SCREENING RECOMMENDATIONS: This normal result is reassuring and indicates that it is unlikely Elizabeth Castro has an increased risk of cancer due to a mutation in one of these genes.  Therefore, Elizabeth Castro was advised to continue following the cancer screening guidelines provided by her primary healthcare providers. Other factors such as her personal and family history may still affect her cancer risk.    Elizabeth Castro should discuss her ovarian cancer risk and screening or surgical options with her GYN physcian.  There are no genetic testing results that would indicate a recommendation for risk reducing BSO, etc, however the patient is still at a somewhat increased risk to develop ovarian cancer simply based on her family history.   Recommendations for ovarian cancer risk management should be based on Elizabeth Castro's personal and family history.   RECOMMENDATIONS FOR FAMILY MEMBERS: Women in this family might be at some increased risk of developing cancer, over the general population risk, simply due to the family history of cancer. We recommended women in this family have a yearly mammogram beginning at age 48, or 96 years younger than the earliest onset of cancer, an annual clinical breast exam, and perform monthly breast self-exams. Women in this family should also have a gynecological exam as recommended by their primary provider. All family members should have a colonoscopy by age 59.  Based on Ms. Bernales's family history, we recommended her siblings and maternal relatives, who are related to her mother with ovarian cancer, have genetic counseling and testing. Ms. Willhelm will let us know if we can be of any assistance in coordinating genetic counseling and/or testing for these family members.   FOLLOW-UP: Lastly, we discussed with Ms. Hanif that cancer genetics is a rapidly advancing field and it is possible that new genetic tests will be appropriate for her and/or her family members in the future. We encouraged her to remain in contact with cancer genetics on an annual basis so we can update her personal and family histories and let her know of advances in cancer genetics that may  benefit this family.   Our contact number was provided. Ms. Morson questions were answered to her satisfaction, and she knows she is welcome to call us at anytime with additional questions or concerns.   Ferol Luz, MS Genetic Counselor [email or web address]

## 2017-02-15 ENCOUNTER — Encounter: Payer: Medicare Other | Admitting: Obstetrics & Gynecology

## 2017-04-07 ENCOUNTER — Encounter: Payer: Medicare Other | Admitting: Obstetrics & Gynecology

## 2017-04-10 ENCOUNTER — Encounter: Payer: Self-pay | Admitting: Obstetrics & Gynecology

## 2017-04-10 ENCOUNTER — Ambulatory Visit (INDEPENDENT_AMBULATORY_CARE_PROVIDER_SITE_OTHER): Payer: Medicare HMO | Admitting: Obstetrics & Gynecology

## 2017-04-10 VITALS — BP 128/68 | Ht 64.25 in | Wt 166.0 lb

## 2017-04-10 DIAGNOSIS — Z01419 Encounter for gynecological examination (general) (routine) without abnormal findings: Secondary | ICD-10-CM

## 2017-04-10 DIAGNOSIS — Z124 Encounter for screening for malignant neoplasm of cervix: Secondary | ICD-10-CM | POA: Diagnosis not present

## 2017-04-10 DIAGNOSIS — Z8041 Family history of malignant neoplasm of ovary: Secondary | ICD-10-CM

## 2017-04-10 DIAGNOSIS — Z780 Asymptomatic menopausal state: Secondary | ICD-10-CM

## 2017-04-10 NOTE — Patient Instructions (Signed)
1. Encounter for routine gynecological examination with Papanicolaou smear of cervix Normal gynecologic exam.  Pap reflex done.  Breast exam normal.  Patient will schedule screening mammogram.  Screening colonoscopy to schedule.  Health labs with family physician.  2. Menopause present Well on no hormone replacement therapy.  No postmenopausal bleeding.  Vitamin D supplements, calcium rich nutrition and regular weightbearing physical activity recommended.  Given that last bone density showed very mild osteopenia with a T score of -1.2, will repeat bone density at 3 years, next year.  3. Family history of ovarian cancer Recent genetic testing ([facility]) in patient showed no pathogenic mutations.  Will continue with annual pelvic ultrasounds with or without repeat CA 125 at annual gynecologic exam.  Patient agrees with plan.  4. Screening for malignant neoplasm of cervix  - Pap IG w/ reflex to HPV when [facility]  Elizabeth Castro, it was a pleasure seeing you today!  I will inform you of your results as soon as they are available.   Health Maintenance for Postmenopausal Women Menopause is a normal process in which your reproductive ability comes to an end. This process happens gradually over a span of months to years, usually between the ages of 48 and 55. Menopause is complete when you have missed 12 consecutive menstrual periods. It is important to talk with your health care provider about some of the most common conditions that affect postmenopausal women, such as heart disease, cancer, and bone loss (osteoporosis). Adopting a healthy lifestyle and getting preventive care can help to promote your health and wellness. Those actions can also lower your chances of developing some of these common conditions. What should I know about menopause? During menopause, you may experience a number of symptoms, such as:  Moderate-to-severe hot flashes.  Night sweats.  Decrease in sex drive.  Mood  swings.  Headaches.  Tiredness.  Irritability.  Memory problems.  Insomnia.  Choosing to treat or not to treat menopausal changes is an individual decision that you make with your health care provider. What should I know about hormone replacement therapy and supplements? Hormone therapy products are effective for treating symptoms that are associated with menopause, such as hot flashes and night sweats. Hormone replacement carries certain risks, especially as you become older. If you are thinking about using estrogen or estrogen with progestin treatments, discuss the benefits and risks with your health care provider. What should I know about heart disease and stroke? Heart disease, heart attack, and stroke become more likely as you age. This may be due, in part, to the hormonal changes that your body experiences during menopause. These can affect how your body processes dietary fats, triglycerides, and cholesterol. Heart attack and stroke are both medical emergencies. There are many things that you can do to help prevent heart disease and stroke:  Have your blood pressure checked at least every 1-2 years. High blood pressure causes heart disease and increases the risk of stroke.  If you are 55-79 years old, ask your health care provider if you should take aspirin to prevent a heart attack or a stroke.  Do not use any tobacco products, including cigarettes, chewing tobacco, or electronic cigarettes. If you need help quitting, ask your health care provider.  It is important to eat a healthy diet and maintain a healthy weight. ? Be sure to include plenty of vegetables, fruits, low-fat dairy products, and lean protein. ? Avoid eating foods that are high in solid fats, added sugars, or salt (sodium).  Get   regular exercise. This is one of the most important things that you can do for your health. ? Try to exercise for at least 150 minutes each week. The type of exercise that you do should  increase your heart rate and make you sweat. This is known as moderate-intensity exercise. ? Try to do strengthening exercises at least twice each week. Do these in addition to the moderate-intensity exercise.  Know your numbers.Ask your health care provider to check your cholesterol and your blood glucose. Continue to have your blood tested as directed by your health care provider.  What should I know about cancer screening? There are several types of cancer. Take the following steps to reduce your risk and to catch any cancer development as early as possible. Breast Cancer  Practice breast self-awareness. ? This means understanding how your breasts normally appear and feel. ? It also means doing regular breast self-exams. Let your health care provider know about any changes, no matter how small.  If you are 67 or older, have a clinician do a breast exam (clinical breast exam or CBE) every year. Depending on your age, family history, and medical history, it may be recommended that you also have a yearly breast X-ray (mammogram).  If you have a family history of breast cancer, talk with your health care provider about genetic screening.  If you are at high risk for breast cancer, talk with your health care provider about having an MRI and a mammogram every year.  Breast cancer (BRCA) gene test is recommended for women who have family members with BRCA-related cancers. Results of the assessment will determine the need for genetic counseling and BRCA1 and for BRCA2 testing. BRCA-related cancers include these types: ? Breast. This occurs in males or females. ? Ovarian. ? Tubal. This may also be called fallopian tube cancer. ? Cancer of the abdominal or pelvic lining (peritoneal cancer). ? Prostate. ? Pancreatic.  Cervical, Uterine, and Ovarian Cancer Your health care provider may recommend that you be screened regularly for cancer of the pelvic organs. These include your ovaries, uterus,  and vagina. This screening involves a pelvic exam, which includes checking for microscopic changes to the surface of your cervix (Pap test).  For women ages 21-65, health care providers may recommend a pelvic exam and a Pap test every three years. For women ages 36-65, they may recommend the Pap test and pelvic exam, combined with testing for human papilloma virus (HPV), every five years. Some types of HPV increase your risk of cervical cancer. Testing for HPV may also be done on women of any age who have unclear Pap test results.  Other health care providers may not recommend any screening for nonpregnant women who are considered low risk for pelvic cancer and have no symptoms. Ask your health care provider if a screening pelvic exam is right for you.  If you have had past treatment for cervical cancer or a condition that could lead to cancer, you need Pap tests and screening for cancer for at least 20 years after your treatment. If Pap tests have been discontinued for you, your risk factors (such as having a new sexual partner) need to be reassessed to determine if you should start having screenings again. Some women have medical problems that increase the chance of getting cervical cancer. In these cases, your health care provider may recommend that you have screening and Pap tests more often.  If you have a family history of uterine cancer or ovarian cancer,  talk with your health care provider about genetic screening.  If you have vaginal bleeding after reaching menopause, tell your health care provider.  There are currently no reliable tests available to screen for ovarian cancer.  Lung Cancer Lung cancer screening is recommended for adults 55-80 years old who are at high risk for lung cancer because of a history of smoking. A yearly low-dose CT scan of the lungs is recommended if you:  Currently smoke.  Have a history of at least 30 pack-years of smoking and you currently smoke or have quit  within the past 15 years. A pack-year is smoking an average of one pack of cigarettes per day for one year.  Yearly screening should:  Continue until it has been 15 years since you quit.  Stop if you develop a health problem that would prevent you from having lung cancer treatment.  Colorectal Cancer  This type of cancer can be detected and can often be prevented.  Routine colorectal cancer screening usually begins at age 50 and continues through age 75.  If you have risk factors for colon cancer, your health care provider may recommend that you be screened at an earlier age.  If you have a family history of colorectal cancer, talk with your health care provider about genetic screening.  Your health care provider may also recommend using home test kits to check for hidden blood in your stool.  A small camera at the end of a tube can be used to examine your colon directly (sigmoidoscopy or colonoscopy). This is done to check for the earliest forms of colorectal cancer.  Direct examination of the colon should be repeated every 5-10 years until age 75. However, if early forms of precancerous polyps or small growths are found or if you have a family history or genetic risk for colorectal cancer, you may need to be screened more often.  Skin Cancer  Check your skin from head to toe regularly.  Monitor any moles. Be sure to tell your health care provider: ? About any new moles or changes in moles, especially if there is a change in a mole's shape or color. ? If you have a mole that is larger than the size of a pencil eraser.  If any of your family members has a history of skin cancer, especially at a young age, talk with your health care provider about genetic screening.  Always use sunscreen. Apply sunscreen liberally and repeatedly throughout the day.  Whenever you are outside, protect yourself by wearing long sleeves, pants, a wide-brimmed hat, and sunglasses.  What should I know  about osteoporosis? Osteoporosis is a condition in which bone destruction happens more quickly than new bone creation. After menopause, you may be at an increased risk for osteoporosis. To help prevent osteoporosis or the bone fractures that can happen because of osteoporosis, the following is recommended:  If you are 19-50 years old, get at least 1,000 mg of calcium and at least 600 mg of vitamin D per day.  If you are older than age 50 but younger than age 70, get at least 1,200 mg of calcium and at least 600 mg of vitamin D per day.  If you are older than age 70, get at least 1,200 mg of calcium and at least 800 mg of vitamin D per day.  Smoking and excessive alcohol intake increase the risk of osteoporosis. Eat foods that are rich in calcium and vitamin D, and do weight-bearing exercises several times each   week as directed by your health care provider. What should I know about how menopause affects my mental health? Depression may occur at any age, but it is more common as you become older. Common symptoms of depression include:  Low or sad mood.  Changes in sleep patterns.  Changes in appetite or eating patterns.  Feeling an overall lack of motivation or enjoyment of activities that you previously enjoyed.  Frequent crying spells.  Talk with your health care provider if you think that you are experiencing depression. What should I know about immunizations? It is important that you get and maintain your immunizations. These include:  Tetanus, diphtheria, and pertussis (Tdap) booster vaccine.  Influenza every year before the flu season begins.  Pneumonia vaccine.  Shingles vaccine.  Your health care provider may also recommend other immunizations. This information is not intended to replace advice given to you by your health care provider. Make sure you discuss any questions you have with your health care provider. Document Released: 05/06/2005 Document Revised: 10/02/2015  Document Reviewed: 12/16/2014 Elsevier Interactive Patient Education  2018 [employer].

## 2017-04-10 NOTE — Progress Notes (Signed)
Elizabeth Castro 12-04-1945 [id number]   History:    72 y.o. G2P2L2 Married  RP:  Established patient presenting for annual gyn exam   HPI: Menopause, well on no hormone replacement therapy.  No postmenopausal bleeding.  No pelvic pain.  In August 2018 patient had a pelvic ultrasound showing a decrease in size of a very small echogenic focal area on the left ovary measuring 9 mm (previously 1.5 cm).  In August 2018 as well, her Ca125 was negative at 4.  Mother with Ovarian Ca at age 68.  Patient had [facility] Genetic screening which showed no pathogenic mutations.  Breasts normal.  Urine and bowel movements normal.  Patient is physically active regularly.  Body mass index 28.27.  Past medical history,surgical history, family history and social history were all reviewed and documented in the EPIC chart.  Gynecologic History No LMP recorded. Patient is postmenopausal. Contraception: abstinence and post menopausal status Last Pap: 2015.  Results were: normal Last mammogram: 2017. Results were: normal Bone density 02/2015 Mild Osteopenia T-score -1.2 Colono to schedule  Obstetric History OB History  Gravida Para Term Preterm AB Living  2 2       2   SAB TAB Ectopic Multiple Live Births               # Outcome Date GA Lbr Len/2nd Weight Sex Delivery Anes PTL Lv  2 Para     F Vag-Spont     1 Para     F Vag-Spont          ROS: A ROS was performed and pertinent positives and negatives are included in the history.  GENERAL: No fevers or chills. HEENT: No change in vision, no earache, sore throat or sinus congestion. NECK: No pain or stiffness. CARDIOVASCULAR: No chest pain or pressure. No palpitations. PULMONARY: No shortness of breath, cough or wheeze. GASTROINTESTINAL: No abdominal pain, nausea, vomiting or diarrhea, melena or bright red blood per rectum. GENITOURINARY: No urinary frequency, urgency, hesitancy or dysuria. MUSCULOSKELETAL: No joint or muscle pain, no back pain, no recent  trauma. DERMATOLOGIC: No rash, no itching, no lesions. ENDOCRINE: No polyuria, polydipsia, no heat or cold intolerance. No recent change in weight. HEMATOLOGICAL: No anemia or easy bruising or bleeding. NEUROLOGIC: No headache, seizures, numbness, tingling or weakness. PSYCHIATRIC: No depression, no loss of interest in normal activity or change in sleep pattern.     Exam:   BP 128/68   Ht 5' 4.25" (1.632 m)   Wt 166 lb (75.3 kg)   BMI 28.27 kg/m   Body mass index is 28.27 kg/m.  General appearance : Well developed well nourished female. No acute distress HEENT: Eyes: no retinal hemorrhage or exudates,  Neck supple, trachea midline, no carotid bruits, no thyroidmegaly Lungs: Clear to auscultation, no rhonchi or wheezes, or rib retractions  Heart: Regular rate and rhythm, no murmurs or gallops Breast:Examined in sitting and supine position were symmetrical in appearance, no palpable masses or tenderness,  no skin retraction, no nipple inversion, no nipple discharge, no skin discoloration, no axillary or supraclavicular lymphadenopathy Abdomen: no palpable masses or tenderness, no rebound or guarding Extremities: no edema or skin discoloration or tenderness  Pelvic:  Bartholin, Urethra, Skene Glands: Within normal limits             Vagina: No gross lesions or discharge  Cervix: No gross lesions or discharge.  Pap reflex done  Uterus  AV, normal size, shape and consistency, non-tender and mobile  Adnexa  Without masses or tenderness  Anus and perineum  normal    Assessment/Plan:  72 y.o. female for annual exam   1. Encounter for routine gynecological examination with Papanicolaou smear of cervix Normal gynecologic exam.  Pap reflex done.  Breast exam normal.  Patient will schedule screening mammogram.  Screening colonoscopy to schedule.  Health labs with family physician.  2. Menopause present Well on no hormone replacement therapy.  No postmenopausal bleeding.  Vitamin D  supplements, calcium rich nutrition and regular weightbearing physical activity recommended.  Given that last bone density showed very mild osteopenia with a T score of -1.2, will repeat bone density at 3 years, next year.  3. Family history of ovarian cancer Recent genetic testing ([facility]) in patient showed no pathogenic mutations.  Will continue with annual pelvic ultrasounds with or without repeat CA 125 at annual gynecologic exam.  Patient agrees with plan.  4. Screening for malignant neoplasm of cervix  - Pap IG w/ reflex to HPV when [facility]  Counseling on above issues more than 50% for 15 minutes. Princess Bruins MD, 12:27 PM 04/10/2017

## 2017-04-12 LAB — PAP IG W/ RFLX HPV ASCU

## 2017-05-10 DIAGNOSIS — Z1231 Encounter for screening mammogram for malignant neoplasm of breast: Secondary | ICD-10-CM | POA: Diagnosis not present

## 2017-05-23 DIAGNOSIS — Z86010 Personal history of colonic polyps: Secondary | ICD-10-CM | POA: Diagnosis not present

## 2017-05-23 DIAGNOSIS — K641 Second degree hemorrhoids: Secondary | ICD-10-CM | POA: Diagnosis not present

## 2017-05-23 DIAGNOSIS — Z1211 Encounter for screening for malignant neoplasm of colon: Secondary | ICD-10-CM | POA: Diagnosis not present

## 2017-06-05 DIAGNOSIS — Z1211 Encounter for screening for malignant neoplasm of colon: Secondary | ICD-10-CM | POA: Diagnosis not present

## 2017-06-05 DIAGNOSIS — Z86010 Personal history of colonic polyps: Secondary | ICD-10-CM | POA: Diagnosis not present

## 2017-06-05 DIAGNOSIS — D123 Benign neoplasm of transverse colon: Secondary | ICD-10-CM | POA: Diagnosis not present

## 2017-06-05 DIAGNOSIS — D122 Benign neoplasm of ascending colon: Secondary | ICD-10-CM | POA: Diagnosis not present

## 2017-06-05 DIAGNOSIS — K635 Polyp of colon: Secondary | ICD-10-CM | POA: Diagnosis not present

## 2017-06-05 LAB — HM COLONOSCOPY

## 2017-06-29 DIAGNOSIS — M1990 Unspecified osteoarthritis, unspecified site: Secondary | ICD-10-CM | POA: Diagnosis not present

## 2017-06-29 DIAGNOSIS — I10 Essential (primary) hypertension: Secondary | ICD-10-CM | POA: Diagnosis not present

## 2017-06-29 DIAGNOSIS — E7800 Pure hypercholesterolemia, unspecified: Secondary | ICD-10-CM | POA: Diagnosis not present

## 2017-06-29 DIAGNOSIS — N182 Chronic kidney disease, stage 2 (mild): Secondary | ICD-10-CM | POA: Diagnosis not present

## 2017-07-21 DIAGNOSIS — M1711 Unilateral primary osteoarthritis, right knee: Secondary | ICD-10-CM | POA: Diagnosis not present

## 2017-07-21 DIAGNOSIS — M1712 Unilateral primary osteoarthritis, left knee: Secondary | ICD-10-CM | POA: Diagnosis not present

## 2017-07-27 DIAGNOSIS — M1712 Unilateral primary osteoarthritis, left knee: Secondary | ICD-10-CM | POA: Diagnosis not present

## 2017-07-27 DIAGNOSIS — M1711 Unilateral primary osteoarthritis, right knee: Secondary | ICD-10-CM | POA: Diagnosis not present

## 2017-08-04 DIAGNOSIS — M1712 Unilateral primary osteoarthritis, left knee: Secondary | ICD-10-CM | POA: Diagnosis not present

## 2017-08-04 DIAGNOSIS — M1711 Unilateral primary osteoarthritis, right knee: Secondary | ICD-10-CM | POA: Diagnosis not present

## 2017-08-22 DIAGNOSIS — R030 Elevated blood-pressure reading, without diagnosis of hypertension: Secondary | ICD-10-CM | POA: Diagnosis not present

## 2017-08-22 DIAGNOSIS — Z809 Family history of malignant neoplasm, unspecified: Secondary | ICD-10-CM | POA: Diagnosis not present

## 2017-08-22 DIAGNOSIS — R609 Edema, unspecified: Secondary | ICD-10-CM | POA: Diagnosis not present

## 2017-08-22 DIAGNOSIS — Z791 Long term (current) use of non-steroidal anti-inflammatories (NSAID): Secondary | ICD-10-CM | POA: Diagnosis not present

## 2017-08-22 DIAGNOSIS — M1990 Unspecified osteoarthritis, unspecified site: Secondary | ICD-10-CM | POA: Diagnosis not present

## 2017-08-22 DIAGNOSIS — E785 Hyperlipidemia, unspecified: Secondary | ICD-10-CM | POA: Diagnosis not present

## 2017-08-31 DIAGNOSIS — H524 Presbyopia: Secondary | ICD-10-CM | POA: Diagnosis not present

## 2017-08-31 DIAGNOSIS — H26493 Other secondary cataract, bilateral: Secondary | ICD-10-CM | POA: Diagnosis not present

## 2017-08-31 DIAGNOSIS — Z961 Presence of intraocular lens: Secondary | ICD-10-CM | POA: Diagnosis not present

## 2017-09-12 DIAGNOSIS — H26491 Other secondary cataract, right eye: Secondary | ICD-10-CM | POA: Diagnosis not present

## 2018-01-04 DIAGNOSIS — Z23 Encounter for immunization: Secondary | ICD-10-CM | POA: Diagnosis not present

## 2018-01-04 DIAGNOSIS — I10 Essential (primary) hypertension: Secondary | ICD-10-CM | POA: Diagnosis not present

## 2018-01-04 DIAGNOSIS — M1990 Unspecified osteoarthritis, unspecified site: Secondary | ICD-10-CM | POA: Diagnosis not present

## 2018-01-04 DIAGNOSIS — Z1159 Encounter for screening for other viral diseases: Secondary | ICD-10-CM | POA: Diagnosis not present

## 2018-01-04 DIAGNOSIS — R51 Headache: Secondary | ICD-10-CM | POA: Diagnosis not present

## 2018-01-04 DIAGNOSIS — Z1389 Encounter for screening for other disorder: Secondary | ICD-10-CM | POA: Diagnosis not present

## 2018-01-04 DIAGNOSIS — E7800 Pure hypercholesterolemia, unspecified: Secondary | ICD-10-CM | POA: Diagnosis not present

## 2018-01-04 DIAGNOSIS — R7309 Other abnormal glucose: Secondary | ICD-10-CM | POA: Diagnosis not present

## 2018-01-04 DIAGNOSIS — N182 Chronic kidney disease, stage 2 (mild): Secondary | ICD-10-CM | POA: Diagnosis not present

## 2018-01-04 DIAGNOSIS — Z0000 Encounter for general adult medical examination without abnormal findings: Secondary | ICD-10-CM | POA: Diagnosis not present

## 2018-04-10 DIAGNOSIS — M1712 Unilateral primary osteoarthritis, left knee: Secondary | ICD-10-CM | POA: Diagnosis not present

## 2018-04-10 DIAGNOSIS — M1711 Unilateral primary osteoarthritis, right knee: Secondary | ICD-10-CM | POA: Diagnosis not present

## 2018-04-11 ENCOUNTER — Encounter: Payer: Medicare HMO | Admitting: Obstetrics & Gynecology

## 2018-05-23 DIAGNOSIS — Z1231 Encounter for screening mammogram for malignant neoplasm of breast: Secondary | ICD-10-CM | POA: Diagnosis not present

## 2018-06-05 ENCOUNTER — Ambulatory Visit (INDEPENDENT_AMBULATORY_CARE_PROVIDER_SITE_OTHER): Payer: Medicare HMO | Admitting: Obstetrics & Gynecology

## 2018-06-05 ENCOUNTER — Encounter: Payer: Self-pay | Admitting: Obstetrics & Gynecology

## 2018-06-05 VITALS — BP 128/86 | Ht 64.0 in | Wt 156.6 lb

## 2018-06-05 DIAGNOSIS — Z780 Asymptomatic menopausal state: Secondary | ICD-10-CM

## 2018-06-05 DIAGNOSIS — Z01419 Encounter for gynecological examination (general) (routine) without abnormal findings: Secondary | ICD-10-CM | POA: Diagnosis not present

## 2018-06-05 DIAGNOSIS — Z8041 Family history of malignant neoplasm of ovary: Secondary | ICD-10-CM | POA: Diagnosis not present

## 2018-06-05 DIAGNOSIS — M8589 Other specified disorders of bone density and structure, multiple sites: Secondary | ICD-10-CM

## 2018-06-05 NOTE — Patient Instructions (Signed)
1. Well female exam with routine gynecological exam Normal gynecologic exam in menopause.  Pap test January 2019 was negative, no indication to repeat the Pap test this year.  Breast exam normal.  Mammogram normal in 2020 per patient, will obtain report.  Health labs with family physician.  Colonoscopy in 2018.  Body mass index 26.88.  Encouraged to exercise regularly with aerobic activities 5 times a week and weightlifting every 2 days.  2. Postmenopausal Well on no hormone replacement therapy.  No postmenopausal bleeding.  3. Osteopenia of multiple sites Osteopenia on bone density December 2016.  Will repeat a bone density here now.  Vitamin D supplements, calcium intake of 1.2 to 1.5 g/day and regular weight bearing physical activities recommended. - DG Bone Density; Future  4. Family history of ovarian cancer Mother with ovarian cancer at age 39.  Patient had negative genetic testing.  Last pelvic ultrasound in August 2018 showed a left ovarian focus that had decreased in size to 0.9 cm compared to a previous ultrasound.  Ca125 August 2018 was normal at 4.  We will repeat a Ca125 today and patient will follow-up for pelvic ultrasound. - CA 125 - US Transvaginal Non-OB; Future  Elizabeth Castro, it was a pleasure seeing you today!  I will inform you of your results as soon as they are available.

## 2018-06-05 NOTE — Progress Notes (Signed)
Elizabeth Castro [birth year] [id number]   History:    73 y.o. G2P2L2  Married  RP:  Established patient presenting for annual gyn exam   HPI:  Menopause, well on no hormone replacement therapy.  No postmenopausal bleeding.  No pelvic pain.  In August 2018 patient had a pelvic ultrasound showing a decrease in size of a very small echogenic focal area on the left ovary measuring 9 mm (previously 1.5 cm).  In August 2018 as well, her Ca125 was negative at 4.  Mother with Ovarian Ca at age 32.  Patient had [facility] Genetic screening which showed no pathogenic mutations.  Breasts normal.  Urine and bowel movements normal.  Patient is physically active regularly.  Body mass index improved at 26.88.  Health labs with Fam MD.  Past medical history,surgical history, family history and social history were all reviewed and documented in the EPIC chart.  Gynecologic History No LMP recorded. Patient is postmenopausal. Contraception: post menopausal status Last Pap: 03/2017. Results were: Negative Last mammogram: 2020. Results were: normal per patient, will obtain report. Bone Density: 02/2015 Mild Osteopenia Colonoscopy: 2018  Obstetric History OB History  Gravida Para Term Preterm AB Living  2 2       2   SAB TAB Ectopic Multiple Live Births               # Outcome Date GA Lbr Len/2nd Weight Sex Delivery Anes PTL Lv  2 Para     F Vag-Spont     1 Para     F Vag-Spont        ROS: A ROS was performed and pertinent positives and negatives are included in the history.  GENERAL: No fevers or chills. HEENT: No change in vision, no earache, sore throat or sinus congestion. NECK: No pain or stiffness. CARDIOVASCULAR: No chest pain or pressure. No palpitations. PULMONARY: No shortness of breath, cough or wheeze. GASTROINTESTINAL: No abdominal pain, nausea, vomiting or diarrhea, melena or bright red blood per rectum. GENITOURINARY: No urinary frequency, urgency, hesitancy or dysuria. MUSCULOSKELETAL: No  joint or muscle pain, no back pain, no recent trauma. DERMATOLOGIC: No rash, no itching, no lesions. ENDOCRINE: No polyuria, polydipsia, no heat or cold intolerance. No recent change in weight. HEMATOLOGICAL: No anemia or easy bruising or bleeding. NEUROLOGIC: No headache, seizures, numbness, tingling or weakness. PSYCHIATRIC: No depression, no loss of interest in normal activity or change in sleep pattern.     Exam:   BP 128/86   Ht 5\' 4"  (1.626 m)   Wt 156 lb 9.6 oz (71 kg)   BMI 26.88 kg/m   Body mass index is 26.88 kg/m.  General appearance : Well developed well nourished female. No acute distress HEENT: Eyes: no retinal hemorrhage or exudates,  Neck supple, trachea midline, no carotid bruits, no thyroidmegaly Lungs: Clear to auscultation, no rhonchi or wheezes, or rib retractions  Heart: Regular rate and rhythm, no murmurs or gallops Breast:Examined in sitting and supine position were symmetrical in appearance, no palpable masses or tenderness,  no skin retraction, no nipple inversion, no nipple discharge, no skin discoloration, no axillary or supraclavicular lymphadenopathy Abdomen: no palpable masses or tenderness, no rebound or guarding Extremities: no edema or skin discoloration or tenderness  Pelvic: Vulva: Normal             Vagina: No gross lesions or discharge  Cervix: No gross lesions or discharge  Uterus  AV, normal size, shape and consistency, non-tender and mobile  Adnexa  Without masses or tenderness  Anus: Normal   Assessment/Plan:  73 y.o. female for annual exam   1. Well female exam with routine gynecological exam Normal gynecologic exam in menopause.  Pap test January 2019 was negative, no indication to repeat the Pap test this year.  Breast exam normal.  Mammogram normal in 2020 per patient, will obtain report.  Health labs with family physician.  Colonoscopy in 2018.  Body mass index 26.88.  Encouraged to exercise regularly with aerobic activities 5 times a  week and weightlifting every 2 days.  2. Postmenopausal Well on no hormone replacement therapy.  No postmenopausal bleeding.  3. Osteopenia of multiple sites Osteopenia on bone density December 2016.  Will repeat a bone density here now.  Vitamin D supplements, calcium intake of 1.2 to 1.5 g/day and regular weight bearing physical activities recommended. - DG Bone Density; Future  4. Family history of ovarian cancer Mother with ovarian cancer at age 109.  Patient had negative genetic testing.  Last pelvic ultrasound in August 2018 showed a left ovarian focus that had decreased in size to 0.9 cm compared to a previous ultrasound.  Ca125 August 2018 was normal at 4.  We will repeat a Ca125 today and patient will follow-up for pelvic ultrasound. - CA 125 - US Transvaginal Non-OB; Future  Counseling on above issues and coordination of care more than 50% for 10 minutes.  Princess Bruins MD, 12:50 PM 06/05/2018

## 2018-06-06 LAB — CA 125: CA 125: 4 U/mL (ref <35)

## 2018-07-05 ENCOUNTER — Encounter: Payer: Self-pay | Admitting: Licensed Clinical Social Worker

## 2018-07-05 NOTE — Progress Notes (Signed)
UPDATE: BLM c.[id number] (p.[id number]) VUS has been reclassified to "Likely Benign." The report date is 07/05/2018.

## 2018-07-16 ENCOUNTER — Other Ambulatory Visit: Payer: Medicare HMO

## 2018-07-16 ENCOUNTER — Ambulatory Visit: Payer: Medicare HMO | Admitting: Obstetrics & Gynecology

## 2018-08-17 DIAGNOSIS — M1711 Unilateral primary osteoarthritis, right knee: Secondary | ICD-10-CM | POA: Diagnosis not present

## 2018-08-17 DIAGNOSIS — M1712 Unilateral primary osteoarthritis, left knee: Secondary | ICD-10-CM | POA: Diagnosis not present

## 2018-08-17 DIAGNOSIS — M170 Bilateral primary osteoarthritis of knee: Secondary | ICD-10-CM | POA: Diagnosis not present

## 2018-08-24 DIAGNOSIS — M1712 Unilateral primary osteoarthritis, left knee: Secondary | ICD-10-CM | POA: Diagnosis not present

## 2018-08-24 DIAGNOSIS — M1711 Unilateral primary osteoarthritis, right knee: Secondary | ICD-10-CM | POA: Diagnosis not present

## 2018-08-24 DIAGNOSIS — M170 Bilateral primary osteoarthritis of knee: Secondary | ICD-10-CM | POA: Diagnosis not present

## 2018-08-31 DIAGNOSIS — M170 Bilateral primary osteoarthritis of knee: Secondary | ICD-10-CM | POA: Diagnosis not present

## 2019-01-14 DIAGNOSIS — Z23 Encounter for immunization: Secondary | ICD-10-CM | POA: Diagnosis not present

## 2019-01-14 DIAGNOSIS — Z0000 Encounter for general adult medical examination without abnormal findings: Secondary | ICD-10-CM | POA: Diagnosis not present

## 2019-01-14 DIAGNOSIS — N182 Chronic kidney disease, stage 2 (mild): Secondary | ICD-10-CM | POA: Diagnosis not present

## 2019-01-14 DIAGNOSIS — R51 Headache with orthostatic component, not elsewhere classified: Secondary | ICD-10-CM | POA: Diagnosis not present

## 2019-01-14 DIAGNOSIS — Z1389 Encounter for screening for other disorder: Secondary | ICD-10-CM | POA: Diagnosis not present

## 2019-01-14 DIAGNOSIS — M67449 Ganglion, unspecified hand: Secondary | ICD-10-CM | POA: Diagnosis not present

## 2019-01-14 DIAGNOSIS — I10 Essential (primary) hypertension: Secondary | ICD-10-CM | POA: Diagnosis not present

## 2019-01-14 DIAGNOSIS — M1990 Unspecified osteoarthritis, unspecified site: Secondary | ICD-10-CM | POA: Diagnosis not present

## 2019-01-14 DIAGNOSIS — E7800 Pure hypercholesterolemia, unspecified: Secondary | ICD-10-CM | POA: Diagnosis not present

## 2019-01-14 DIAGNOSIS — R7309 Other abnormal glucose: Secondary | ICD-10-CM | POA: Diagnosis not present

## 2019-01-21 DIAGNOSIS — M79641 Pain in right hand: Secondary | ICD-10-CM | POA: Insufficient documentation

## 2019-01-23 DIAGNOSIS — G5601 Carpal tunnel syndrome, right upper limb: Secondary | ICD-10-CM | POA: Diagnosis not present

## 2019-01-23 DIAGNOSIS — M67441 Ganglion, right hand: Secondary | ICD-10-CM | POA: Diagnosis not present

## 2019-01-23 DIAGNOSIS — M79641 Pain in right hand: Secondary | ICD-10-CM | POA: Diagnosis not present

## 2019-01-27 DIAGNOSIS — Z20828 Contact with and (suspected) exposure to other viral communicable diseases: Secondary | ICD-10-CM | POA: Diagnosis not present

## 2019-01-27 DIAGNOSIS — M170 Bilateral primary osteoarthritis of knee: Secondary | ICD-10-CM | POA: Diagnosis not present

## 2019-01-28 DIAGNOSIS — G5601 Carpal tunnel syndrome, right upper limb: Secondary | ICD-10-CM | POA: Diagnosis not present

## 2019-01-31 DIAGNOSIS — M1711 Unilateral primary osteoarthritis, right knee: Secondary | ICD-10-CM | POA: Diagnosis not present

## 2019-01-31 DIAGNOSIS — M1712 Unilateral primary osteoarthritis, left knee: Secondary | ICD-10-CM | POA: Diagnosis not present

## 2019-01-31 DIAGNOSIS — M170 Bilateral primary osteoarthritis of knee: Secondary | ICD-10-CM | POA: Diagnosis not present

## 2019-02-01 DIAGNOSIS — M67441 Ganglion, right hand: Secondary | ICD-10-CM | POA: Diagnosis not present

## 2019-02-01 DIAGNOSIS — G5601 Carpal tunnel syndrome, right upper limb: Secondary | ICD-10-CM | POA: Diagnosis not present

## 2019-04-25 DIAGNOSIS — M1712 Unilateral primary osteoarthritis, left knee: Secondary | ICD-10-CM | POA: Diagnosis not present

## 2019-04-25 DIAGNOSIS — M1711 Unilateral primary osteoarthritis, right knee: Secondary | ICD-10-CM | POA: Diagnosis not present

## 2019-04-25 DIAGNOSIS — M170 Bilateral primary osteoarthritis of knee: Secondary | ICD-10-CM | POA: Diagnosis not present

## 2019-05-06 ENCOUNTER — Ambulatory Visit: Payer: Medicare Other

## 2019-05-09 DIAGNOSIS — M170 Bilateral primary osteoarthritis of knee: Secondary | ICD-10-CM | POA: Diagnosis not present

## 2019-05-15 DIAGNOSIS — M170 Bilateral primary osteoarthritis of knee: Secondary | ICD-10-CM | POA: Diagnosis not present

## 2019-05-15 DIAGNOSIS — M1711 Unilateral primary osteoarthritis, right knee: Secondary | ICD-10-CM | POA: Diagnosis not present

## 2019-05-15 DIAGNOSIS — M1712 Unilateral primary osteoarthritis, left knee: Secondary | ICD-10-CM | POA: Diagnosis not present

## 2019-05-29 DIAGNOSIS — Z1231 Encounter for screening mammogram for malignant neoplasm of breast: Secondary | ICD-10-CM | POA: Diagnosis not present

## 2019-06-02 ENCOUNTER — Ambulatory Visit (HOSPITAL_COMMUNITY)
Admission: EM | Admit: 2019-06-02 | Discharge: 2019-06-02 | Disposition: A | Discharge status: Home / Self Care | Payer: Medicare HMO | Attending: Emergency Medicine | Admitting: Emergency Medicine

## 2019-06-02 ENCOUNTER — Encounter (HOSPITAL_COMMUNITY): Payer: Self-pay

## 2019-06-02 ENCOUNTER — Other Ambulatory Visit: Payer: Self-pay

## 2019-06-02 DIAGNOSIS — M542 Cervicalgia: Secondary | ICD-10-CM | POA: Diagnosis not present

## 2019-06-02 MED ORDER — KETOROLAC TROMETHAMINE 30 MG/ML IJ SOLN
INTRAMUSCULAR | Status: AC
  Filled 2019-06-02: qty 1

## 2019-06-02 MED ORDER — KETOROLAC TROMETHAMINE 30 MG/ML IJ SOLN
15.0000 mg | Freq: Once | INTRAMUSCULAR | Status: AC
  Administered 2019-06-02: 15 mg via INTRAMUSCULAR

## 2019-06-02 MED ORDER — ACETAMINOPHEN 500 MG PO TABS: 1000.0000 mg | ORAL_TABLET | Freq: Three times a day (TID) | ORAL | 0 refills | Status: AC | PRN

## 2019-06-02 MED ORDER — TIZANIDINE HCL 2 MG PO TABS: 2.0000 mg | ORAL_TABLET | Freq: Three times a day (TID) | ORAL | 0 refills | Status: DC | PRN

## 2019-06-02 NOTE — Discharge Instructions (Signed)
Heat, massage, light stretching as able.  1-2 tablets of a muscle relaxer to help with your pain. May cause drowsiness. Please do not take if driving or drinking alcohol.   I am hopeful the injection we have given you today is helpful as well.  Regular tylenol.  May use your tramadol for breakthrough pain, be careful with taking this with the muscle relaxer.  If worsening- develop headache, vision changes, weakness, numbness or tingling please go to the ER.  Follow up with your primary care provider if symptoms persist.

## 2019-06-02 NOTE — ED Triage Notes (Signed)
Pt present right side neck pain, pt states the pain started Thursday evening. When she woke up this am she was unable to move her neck and its throbbing really bad.

## 2019-06-02 NOTE — ED Provider Notes (Signed)
[facility]    CSN: [id number] Arrival date & time: 06/02/19  1504      History   Chief Complaint Chief Complaint  Patient presents with  . Neck Pain    HPI Elizabeth Castro is a 74 y.o. female.   Elizabeth Castro presents with complaints of neck pain. Woke on 3/4 with right sided neck "Cramp".  By this morning it was worse and is bilateral. Worse with any movement of her neck. Throbbing pain. No head injury, no fall, no neck injury or recent MVC. No numbness or tingling, no weakness to upper extremities. No vision changes. Denies any previous similar. She took a tramadol last night which she takes for her knee, which didn't help with her neck pain. History  Of hypertension.     ROS per HPI, negative if not otherwise mentioned.      Past Medical History:  Diagnosis Date  . Family history of ovarian cancer   . High cholesterol   . Hypertension     Patient Active Problem List   Diagnosis Date Noted  . Genetic testing 01/26/2017  . Ovarian cyst, left 03/02/2016  . Osteopenia 02/15/2016  . Family history of ovarian cancer 01/28/2011  . Hypercholesterolemia 01/11/2011  . Postmenopausal 01/11/2011    Past Surgical History:  Procedure Laterality Date  . BREAST SURGERY     LEFT BREAST BIOPSY  . COLONOSCOPY W/ POLYPECTOMY  2002   BENIGN  . ENDOMETRIAL ABLATION  1997   RESECTOSCOPIC POLYPECTOMY  . TUBAL LIGATION  1977    OB History    Gravida  2   Para  2   Term      Preterm      AB      Living  2     SAB      TAB      Ectopic      Multiple      Live Births               Home Medications    Prior to Admission medications   Medication Sig Start Date End Date Taking? Authorizing Provider  acetaminophen (TYLENOL) 500 MG tablet Take 2 tablets (1,000 mg total) by mouth every 8 (eight) hours as needed. 06/02/19   Zigmund Gottron, NP  calcium carbonate (OS-CAL) 600 MG TABS Take 600 mg by mouth 2 (two) times daily with a meal.       [provider]  cholecalciferol (VITAMIN D) 1000 UNITS tablet Take 1,000 Units by mouth daily.    [provider]  hydrochlorothiazide (HYDRODIURIL) 25 MG tablet Take 25 mg by mouth daily.      [provider]  simvastatin (ZOCOR) 40 MG tablet Take 40 mg by mouth at bedtime.      [provider]  tiZANidine (ZANAFLEX) 2 MG tablet Take 1-2 tablets (2-4 mg total) by mouth every 8 (eight) hours as needed for muscle spasms. 06/02/19   Zigmund Gottron, NP    Family History Family History  Problem Relation Age of Onset  . Ovarian cancer Mother 33    Social History Social History   Tobacco Use  . Smoking status: Never Smoker  . Smokeless tobacco: Never Used  Substance Use Topics  . Alcohol use: Yes    Alcohol/week: 0.0 standard drinks    Comment: wine   . Drug use: No     Allergies   No known allergies   Review of Systems Review of Systems  Physical Exam Triage Vital Signs ED Triage Vitals  Enc Vitals Group     BP 06/02/19 1524 (!) 168/76     Pulse Rate 06/02/19 1524 63     Resp 06/02/19 1524 16     Temp 06/02/19 1524 98.5 F (36.9 C)     Temp Source 06/02/19 1524 Oral     SpO2 06/02/19 1524 100 %     Weight --      Height --      Head Circumference --      Peak Flow --      Pain Score 06/02/19 1532 10     Pain Loc --      Pain Edu? --      Excl. in [place]? --    No data found.  Updated Vital Signs BP (!) 168/76 (BP Location: Left Arm)   Pulse 63   Temp 98.5 F (36.9 C) (Oral)   Resp 16   SpO2 100%    Physical Exam Constitutional:      General: She is in acute distress.     Appearance: She is well-developed.  Neck:      Comments: Bilateral lateral neck musculature with tenderness on palpation, pain with rotation flexion and extension of her neck; strength equal bilaterally; gross sensation intact to upper extremities; no pain into scalp  Cardiovascular:     Rate and Rhythm: Normal rate.  Pulmonary:     Effort:  Pulmonary effort is normal.  Musculoskeletal:     Cervical back: Pain with movement present. No spinous process tenderness.  Skin:    General: Skin is warm and dry.  Neurological:     Mental Status: She is alert and oriented to person, place, and time.      UC Treatments / Results  Labs (all labs ordered are listed, but only abnormal results are displayed) Labs Reviewed - No data to display  EKG   Radiology No results found.  Procedures Procedures (including critical care time)  Medications Ordered in UC Medications  ketorolac (TORADOL) 30 MG/ML injection 15 mg (has no administration in time range)    Initial Impression / Assessment and Plan / UC Course  I have reviewed the triage vital signs and the nursing notes.  Pertinent labs & imaging results that were available during my care of the patient were reviewed by me and considered in my medical decision making (see chart for details).     No recent injury or head injury, no recent trauma. No neurological complaints or findings. Originally right neck pain which has progressed and is bilateral, worse with movement. 15mg  im toradol provided and muscle relaxers provided, to continue with tylenol and prn tramadol. Return precautions provided. Patient verbalized understanding and agreeable to plan.  Ambulatory out of clinic without difficulty.    Final Clinical Impressions(s) / UC Diagnoses   Final diagnoses:  Neck pain     Discharge Instructions     Heat, massage, light stretching as able.  1-2 tablets of a muscle relaxer to help with your pain. May cause drowsiness. Please do not take if driving or drinking alcohol.   I am hopeful the injection we have given you today is helpful as well.  Regular tylenol.  May use your tramadol for breakthrough pain, be careful with taking this with the muscle relaxer.  If worsening- develop headache, vision changes, weakness, numbness or tingling please go to the ER.  Follow up  with your primary care provider if symptoms persist.  ED Prescriptions    Medication Sig Dispense Auth. Provider   tiZANidine (ZANAFLEX) 2 MG tablet Take 1-2 tablets (2-4 mg total) by mouth every 8 (eight) hours as needed for muscle spasms. 20 tablet Augusto Gamble B, NP   acetaminophen (TYLENOL) 500 MG tablet Take 2 tablets (1,000 mg total) by mouth every 8 (eight) hours as needed. 30 tablet Zigmund Gottron, NP     PDMP not reviewed this encounter.   Zigmund Gottron, NP 06/02/19 1555

## 2019-06-27 DIAGNOSIS — M170 Bilateral primary osteoarthritis of knee: Secondary | ICD-10-CM | POA: Diagnosis not present

## 2019-06-27 DIAGNOSIS — M1711 Unilateral primary osteoarthritis, right knee: Secondary | ICD-10-CM | POA: Diagnosis not present

## 2019-06-27 DIAGNOSIS — M1712 Unilateral primary osteoarthritis, left knee: Secondary | ICD-10-CM | POA: Diagnosis not present

## 2020-01-03 ENCOUNTER — Ambulatory Visit: Payer: Medicare Other | Attending: Internal Medicine

## 2020-01-03 DIAGNOSIS — Z23 Encounter for immunization: Secondary | ICD-10-CM

## 2020-01-03 NOTE — Progress Notes (Signed)
   Covid-19 Vaccination Clinic  Name:  Elizabeth Castro    MRN: [id number] DOB: 06-May-1945  01/03/2020  Elizabeth Castro was observed post Covid-19 immunization for 15 minutes without incident. She was provided with Vaccine Information Sheet and instruction to access the V-Safe system.   Elizabeth Castro was instructed to call 911 with any severe reactions post vaccine: Marland Kitchen Difficulty breathing  . Swelling of face and throat  . A fast heartbeat  . A bad rash all over body  . Dizziness and weakness

## 2020-01-15 DIAGNOSIS — I10 Essential (primary) hypertension: Secondary | ICD-10-CM | POA: Diagnosis not present

## 2020-01-15 DIAGNOSIS — N182 Chronic kidney disease, stage 2 (mild): Secondary | ICD-10-CM | POA: Diagnosis not present

## 2020-01-15 DIAGNOSIS — M1990 Unspecified osteoarthritis, unspecified site: Secondary | ICD-10-CM | POA: Diagnosis not present

## 2020-01-15 DIAGNOSIS — Z1389 Encounter for screening for other disorder: Secondary | ICD-10-CM | POA: Diagnosis not present

## 2020-01-15 DIAGNOSIS — Z23 Encounter for immunization: Secondary | ICD-10-CM | POA: Diagnosis not present

## 2020-01-15 DIAGNOSIS — E7800 Pure hypercholesterolemia, unspecified: Secondary | ICD-10-CM | POA: Diagnosis not present

## 2020-01-15 DIAGNOSIS — E559 Vitamin D deficiency, unspecified: Secondary | ICD-10-CM | POA: Diagnosis not present

## 2020-01-15 DIAGNOSIS — Z0000 Encounter for general adult medical examination without abnormal findings: Secondary | ICD-10-CM | POA: Diagnosis not present

## 2020-01-15 DIAGNOSIS — R519 Headache, unspecified: Secondary | ICD-10-CM | POA: Diagnosis not present

## 2020-03-07 DIAGNOSIS — G8929 Other chronic pain: Secondary | ICD-10-CM | POA: Diagnosis not present

## 2020-03-07 DIAGNOSIS — M1990 Unspecified osteoarthritis, unspecified site: Secondary | ICD-10-CM | POA: Diagnosis not present

## 2020-03-07 DIAGNOSIS — Z809 Family history of malignant neoplasm, unspecified: Secondary | ICD-10-CM | POA: Diagnosis not present

## 2020-03-07 DIAGNOSIS — Z008 Encounter for other general examination: Secondary | ICD-10-CM | POA: Diagnosis not present

## 2020-03-07 DIAGNOSIS — I10 Essential (primary) hypertension: Secondary | ICD-10-CM | POA: Diagnosis not present

## 2020-03-07 DIAGNOSIS — E785 Hyperlipidemia, unspecified: Secondary | ICD-10-CM | POA: Diagnosis not present

## 2020-03-07 DIAGNOSIS — Z791 Long term (current) use of non-steroidal anti-inflammatories (NSAID): Secondary | ICD-10-CM | POA: Diagnosis not present

## 2020-05-13 DIAGNOSIS — Z20822 Contact with and (suspected) exposure to covid-19: Secondary | ICD-10-CM | POA: Diagnosis not present

## 2020-06-03 ENCOUNTER — Encounter: Payer: Self-pay | Admitting: Obstetrics & Gynecology

## 2020-06-03 DIAGNOSIS — Z1231 Encounter for screening mammogram for malignant neoplasm of breast: Secondary | ICD-10-CM | POA: Diagnosis not present

## 2020-07-15 DIAGNOSIS — E7800 Pure hypercholesterolemia, unspecified: Secondary | ICD-10-CM | POA: Diagnosis not present

## 2020-07-15 DIAGNOSIS — I10 Essential (primary) hypertension: Secondary | ICD-10-CM | POA: Diagnosis not present

## 2020-07-15 DIAGNOSIS — N182 Chronic kidney disease, stage 2 (mild): Secondary | ICD-10-CM | POA: Diagnosis not present

## 2020-07-15 DIAGNOSIS — Z23 Encounter for immunization: Secondary | ICD-10-CM | POA: Diagnosis not present

## 2020-10-14 DIAGNOSIS — I10 Essential (primary) hypertension: Secondary | ICD-10-CM | POA: Diagnosis not present

## 2020-10-15 ENCOUNTER — Encounter: Payer: Self-pay | Admitting: Genetic Counselor

## 2020-10-15 NOTE — Progress Notes (Signed)
UPDATE: CASR [id number] (Silent) VUS has been reclassified to Likely Benign.  The amended report date is October 12, 2020.Marland Kitchen

## 2020-11-17 DIAGNOSIS — H26492 Other secondary cataract, left eye: Secondary | ICD-10-CM | POA: Diagnosis not present

## 2020-11-17 DIAGNOSIS — H5203 Hypermetropia, bilateral: Secondary | ICD-10-CM | POA: Diagnosis not present

## 2020-11-17 DIAGNOSIS — H52203 Unspecified astigmatism, bilateral: Secondary | ICD-10-CM | POA: Diagnosis not present

## 2021-01-20 DIAGNOSIS — Z23 Encounter for immunization: Secondary | ICD-10-CM | POA: Diagnosis not present

## 2021-01-20 DIAGNOSIS — N182 Chronic kidney disease, stage 2 (mild): Secondary | ICD-10-CM | POA: Diagnosis not present

## 2021-01-20 DIAGNOSIS — Z1331 Encounter for screening for depression: Secondary | ICD-10-CM | POA: Diagnosis not present

## 2021-01-20 DIAGNOSIS — Z0000 Encounter for general adult medical examination without abnormal findings: Secondary | ICD-10-CM | POA: Diagnosis not present

## 2021-01-20 DIAGNOSIS — Z1389 Encounter for screening for other disorder: Secondary | ICD-10-CM | POA: Diagnosis not present

## 2021-01-20 DIAGNOSIS — I10 Essential (primary) hypertension: Secondary | ICD-10-CM | POA: Diagnosis not present

## 2021-01-20 DIAGNOSIS — E7800 Pure hypercholesterolemia, unspecified: Secondary | ICD-10-CM | POA: Diagnosis not present

## 2021-01-20 DIAGNOSIS — R519 Headache, unspecified: Secondary | ICD-10-CM | POA: Diagnosis not present

## 2021-04-08 ENCOUNTER — Other Ambulatory Visit: Payer: Self-pay | Admitting: Internal Medicine

## 2021-04-08 DIAGNOSIS — R410 Disorientation, unspecified: Secondary | ICD-10-CM | POA: Diagnosis not present

## 2021-04-30 ENCOUNTER — Ambulatory Visit
Admission: RE | Admit: 2021-04-30 | Discharge: 2021-04-30 | Disposition: A | Discharge status: Home / Self Care | Payer: Medicare HMO | Source: Ambulatory Visit | Attending: Internal Medicine | Admitting: Internal Medicine

## 2021-04-30 DIAGNOSIS — R410 Disorientation, unspecified: Secondary | ICD-10-CM

## 2021-06-09 DIAGNOSIS — Z1231 Encounter for screening mammogram for malignant neoplasm of breast: Secondary | ICD-10-CM | POA: Diagnosis not present

## 2021-12-15 DIAGNOSIS — H26492 Other secondary cataract, left eye: Secondary | ICD-10-CM | POA: Diagnosis not present

## 2021-12-15 DIAGNOSIS — Z961 Presence of intraocular lens: Secondary | ICD-10-CM | POA: Diagnosis not present

## 2022-01-05 DIAGNOSIS — M1712 Unilateral primary osteoarthritis, left knee: Secondary | ICD-10-CM | POA: Diagnosis not present

## 2022-01-05 DIAGNOSIS — M1711 Unilateral primary osteoarthritis, right knee: Secondary | ICD-10-CM | POA: Diagnosis not present

## 2022-01-25 DIAGNOSIS — Z1331 Encounter for screening for depression: Secondary | ICD-10-CM | POA: Diagnosis not present

## 2022-01-25 DIAGNOSIS — R7309 Other abnormal glucose: Secondary | ICD-10-CM | POA: Diagnosis not present

## 2022-01-25 DIAGNOSIS — E7800 Pure hypercholesterolemia, unspecified: Secondary | ICD-10-CM | POA: Diagnosis not present

## 2022-01-25 DIAGNOSIS — I10 Essential (primary) hypertension: Secondary | ICD-10-CM | POA: Diagnosis not present

## 2022-01-25 DIAGNOSIS — N182 Chronic kidney disease, stage 2 (mild): Secondary | ICD-10-CM | POA: Diagnosis not present

## 2022-01-25 DIAGNOSIS — Z0000 Encounter for general adult medical examination without abnormal findings: Secondary | ICD-10-CM | POA: Diagnosis not present

## 2022-01-25 DIAGNOSIS — E559 Vitamin D deficiency, unspecified: Secondary | ICD-10-CM | POA: Diagnosis not present

## 2022-01-25 DIAGNOSIS — M1990 Unspecified osteoarthritis, unspecified site: Secondary | ICD-10-CM | POA: Diagnosis not present

## 2022-01-25 DIAGNOSIS — R519 Headache, unspecified: Secondary | ICD-10-CM | POA: Diagnosis not present

## 2022-01-25 DIAGNOSIS — E2839 Other primary ovarian failure: Secondary | ICD-10-CM | POA: Diagnosis not present

## 2022-02-23 DIAGNOSIS — D72819 Decreased white blood cell count, unspecified: Secondary | ICD-10-CM | POA: Diagnosis not present

## 2022-05-04 DIAGNOSIS — R413 Other amnesia: Secondary | ICD-10-CM | POA: Diagnosis not present

## 2022-05-17 ENCOUNTER — Other Ambulatory Visit: Payer: Self-pay | Admitting: Internal Medicine

## 2022-05-17 DIAGNOSIS — R413 Other amnesia: Secondary | ICD-10-CM

## 2022-05-18 ENCOUNTER — Ambulatory Visit
Admission: RE | Admit: 2022-05-18 | Discharge: 2022-05-18 | Disposition: A | Discharge status: Home / Self Care | Payer: Medicare HMO | Source: Ambulatory Visit | Attending: Internal Medicine | Admitting: Internal Medicine

## 2022-05-18 DIAGNOSIS — R413 Other amnesia: Secondary | ICD-10-CM | POA: Diagnosis not present

## 2022-06-24 IMAGING — CT CT HEAD W/O CM
4 series · 16 of 47 positions shown, 18 images · non-contrast
Comparison: no prior CT, correlation is made with MRI 12/10/2012

CLINICAL DATA: Confusion and disorientation



[Series 2: head 5.00 hr40 s3 axial ibhc · axial · 0.43mm/px · z∈[-643,-523]mm · 7 of 32 slices shown, 9 images]
[im 4/32  brain]
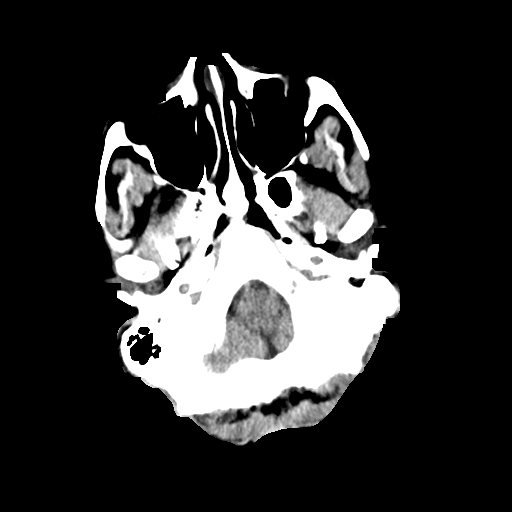
[im 4/32  bone]
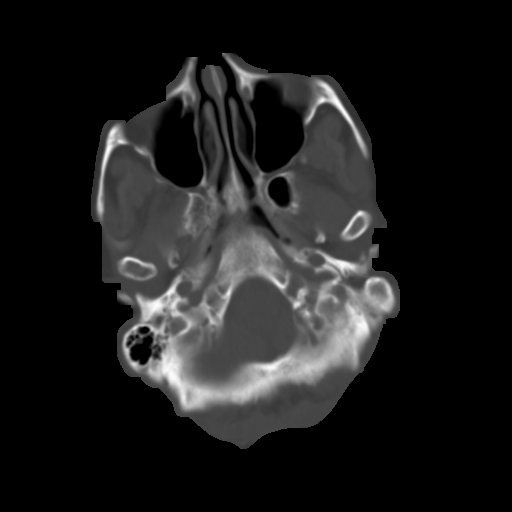
[im 8/32  brain]
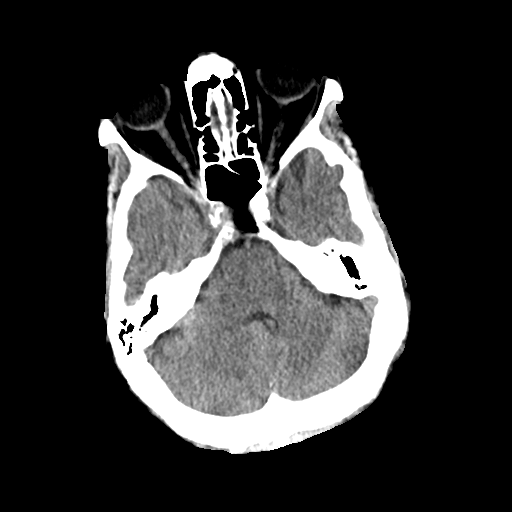
[im 12/32  brain]
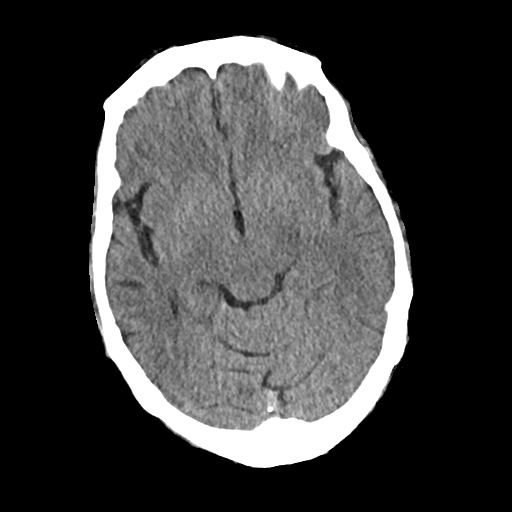
[im 16/32  brain]
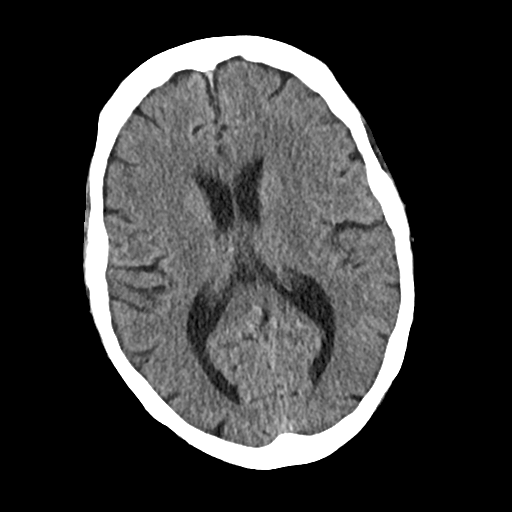
[im 20/32  brain]
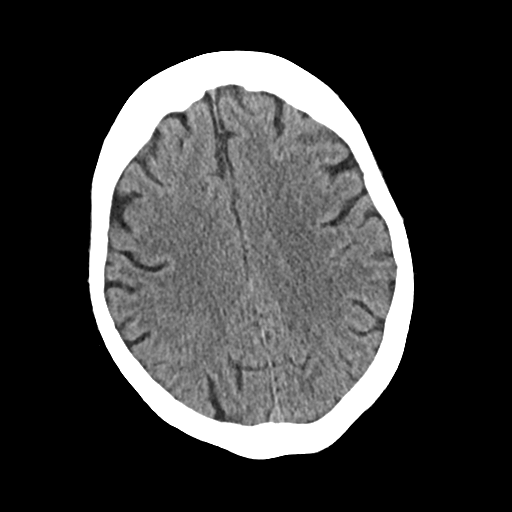
[im 20/32  bone]
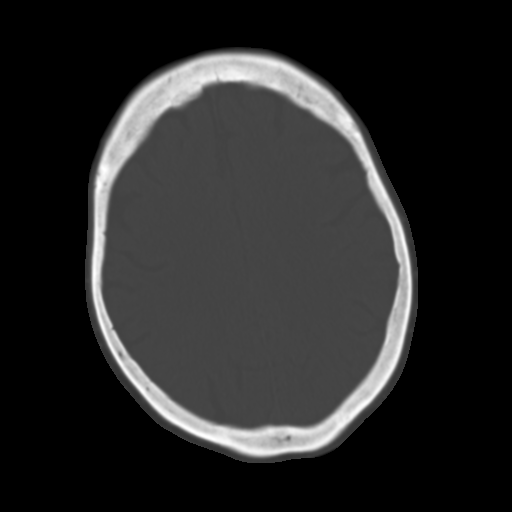
[im 24/32  brain]
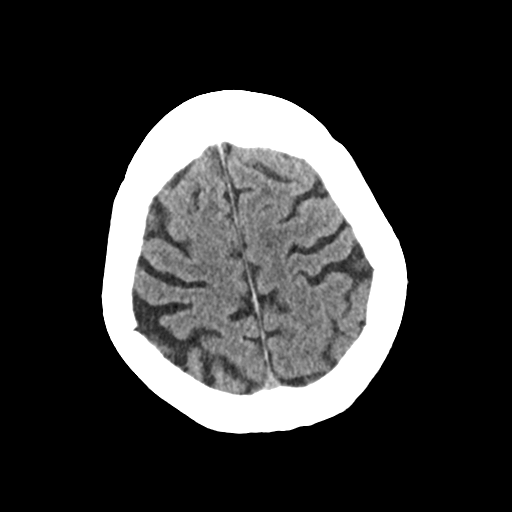
[im 28/32  brain]
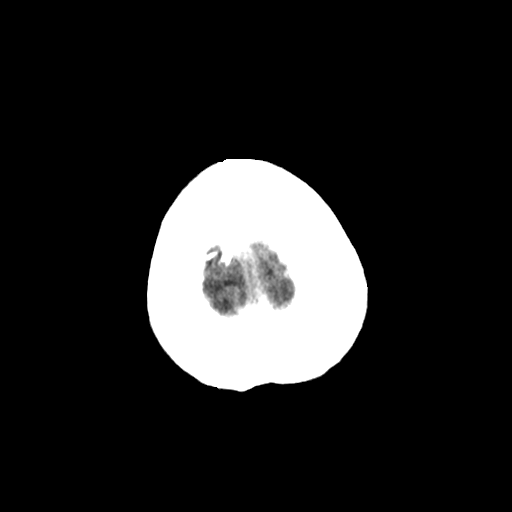

[Series 3: head 2.00 hr60 s3 axial bone · axial · 0.44mm/px · z∈[-646,-614]mm · 3 of 80 slices shown]
[im 8/80  bone]
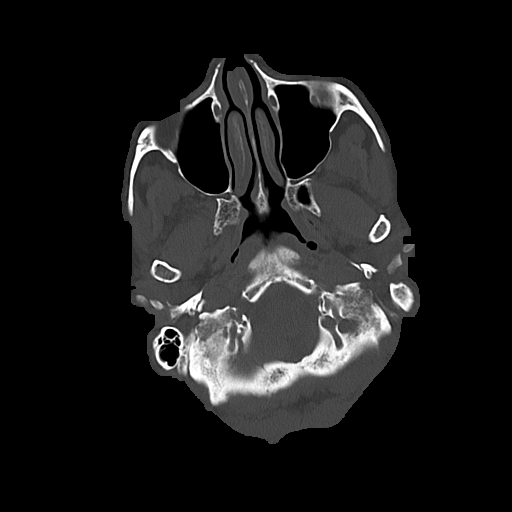
[im 16/80  bone]
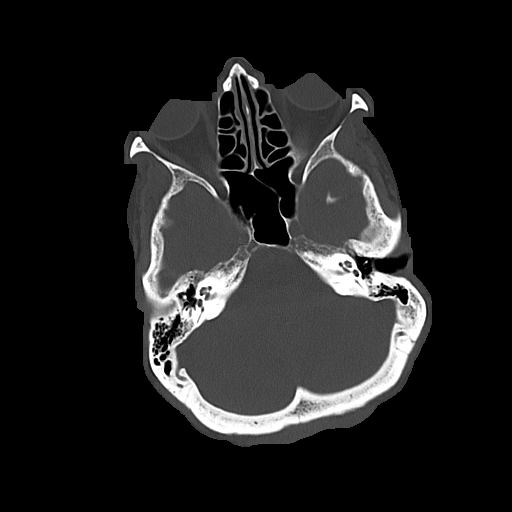
[im 24/80  bone]
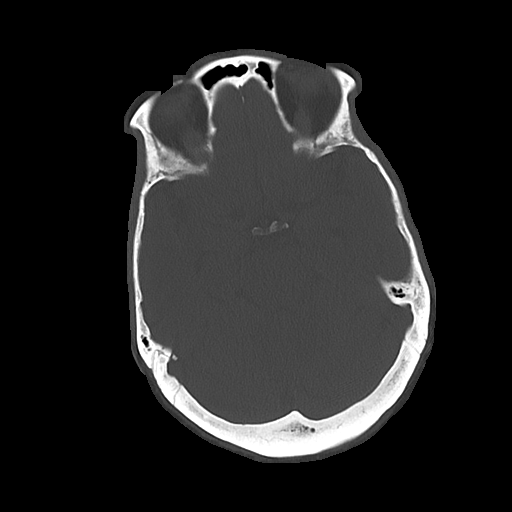

[Series 4: head 3.00 hr40 s3 sag · sagittal · 0.33mm/px · 3 of 56 slices shown]
[im 19/56  brain]
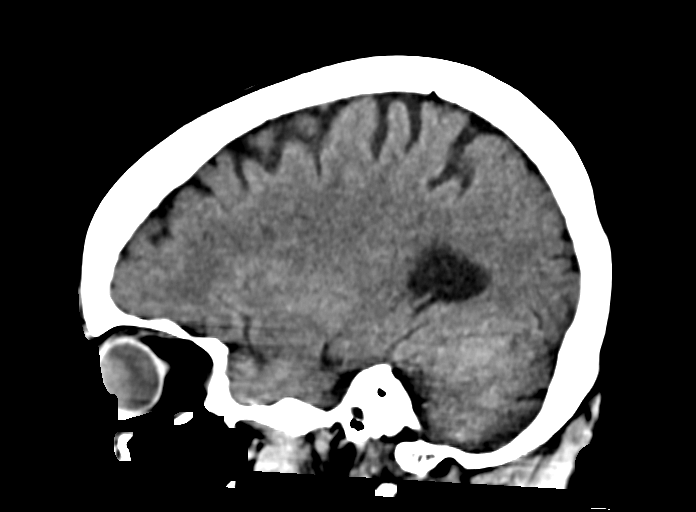
[im 28/56  brain]
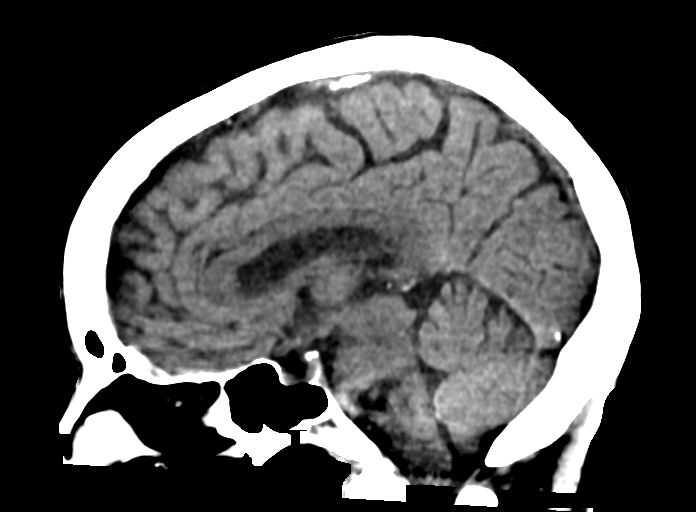
[im 37/56  brain]
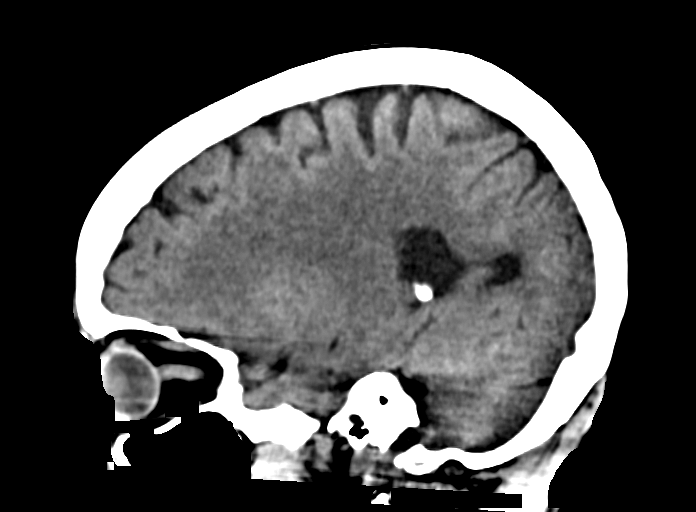

[Series 6: head 3.00 hr40 s3 cor · coronal · 0.33mm/px · 3 of 76 slices shown]
[im 26/76  brain]
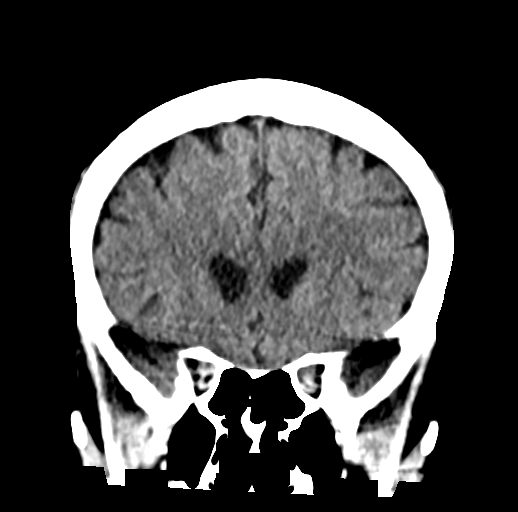
[im 34/76  brain]
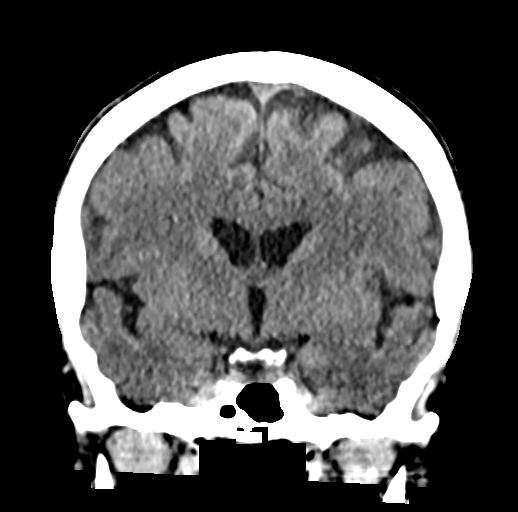
[im 42/76  brain]
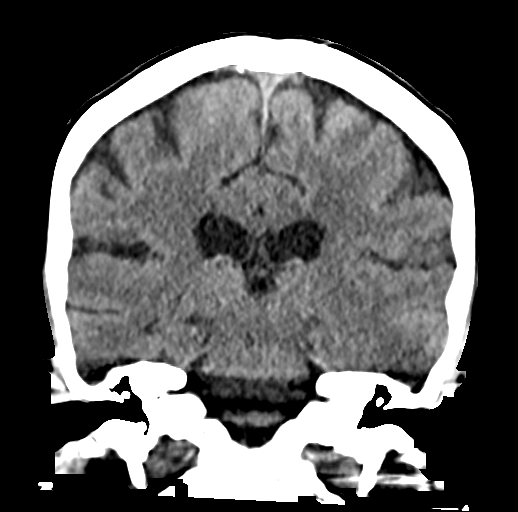

[16 of 47 positions shown; findings below may reference images not displayed]

FINDINGS: Brain: No evidence of acute infarction, hemorrhage, cerebral edema,
mass, mass effect, or midline shift. No hydrocephalus or extra-axial
fluid collection. Cerebral volume is within normal limits for age.

Vascular: No hyperdense vessel.

Skull: Normal. Negative for fracture or focal lesion.

Sinuses/Orbits: No acute finding. Status post bilateral lens
replacements.

Other: The mastoid air cells are well aerated.
IMPRESSION: IMPRESSION
No acute intracranial process. No etiology is seen for the patient's
confusion and disorientation.

## 2022-07-25 DIAGNOSIS — E663 Overweight: Secondary | ICD-10-CM | POA: Diagnosis not present

## 2022-07-25 DIAGNOSIS — Z6828 Body mass index (BMI) 28.0-28.9, adult: Secondary | ICD-10-CM | POA: Diagnosis not present

## 2022-07-25 DIAGNOSIS — Z0000 Encounter for general adult medical examination without abnormal findings: Secondary | ICD-10-CM | POA: Diagnosis not present

## 2022-07-25 DIAGNOSIS — Z7189 Other specified counseling: Secondary | ICD-10-CM | POA: Diagnosis not present

## 2022-09-16 ENCOUNTER — Encounter (HOSPITAL_BASED_OUTPATIENT_CLINIC_OR_DEPARTMENT_OTHER): Payer: Self-pay | Admitting: Emergency Medicine

## 2022-09-16 ENCOUNTER — Emergency Department (HOSPITAL_BASED_OUTPATIENT_CLINIC_OR_DEPARTMENT_OTHER): Payer: Medicare HMO | Admitting: Radiology

## 2022-09-16 ENCOUNTER — Emergency Department (HOSPITAL_BASED_OUTPATIENT_CLINIC_OR_DEPARTMENT_OTHER): Payer: Medicare HMO

## 2022-09-16 ENCOUNTER — Other Ambulatory Visit: Payer: Self-pay

## 2022-09-16 ENCOUNTER — Emergency Department (HOSPITAL_BASED_OUTPATIENT_CLINIC_OR_DEPARTMENT_OTHER)
Admission: EM | Admit: 2022-09-16 | Discharge: 2022-09-16 | Disposition: A | Discharge status: Home / Self Care | Payer: Medicare HMO | Attending: Emergency Medicine | Admitting: Emergency Medicine

## 2022-09-16 DIAGNOSIS — R609 Edema, unspecified: Secondary | ICD-10-CM | POA: Insufficient documentation

## 2022-09-16 DIAGNOSIS — R001 Bradycardia, unspecified: Secondary | ICD-10-CM | POA: Insufficient documentation

## 2022-09-16 DIAGNOSIS — R791 Abnormal coagulation profile: Secondary | ICD-10-CM | POA: Diagnosis not present

## 2022-09-16 DIAGNOSIS — M7989 Other specified soft tissue disorders: Secondary | ICD-10-CM | POA: Insufficient documentation

## 2022-09-16 DIAGNOSIS — N189 Chronic kidney disease, unspecified: Secondary | ICD-10-CM | POA: Diagnosis not present

## 2022-09-16 DIAGNOSIS — Z79899 Other long term (current) drug therapy: Secondary | ICD-10-CM | POA: Diagnosis not present

## 2022-09-16 DIAGNOSIS — R0602 Shortness of breath: Secondary | ICD-10-CM | POA: Diagnosis not present

## 2022-09-16 DIAGNOSIS — R600 Localized edema: Secondary | ICD-10-CM | POA: Insufficient documentation

## 2022-09-16 DIAGNOSIS — I130 Hypertensive heart and chronic kidney disease with heart failure and stage 1 through stage 4 chronic kidney disease, or unspecified chronic kidney disease: Secondary | ICD-10-CM | POA: Insufficient documentation

## 2022-09-16 DIAGNOSIS — I509 Heart failure, unspecified: Secondary | ICD-10-CM | POA: Insufficient documentation

## 2022-09-16 DIAGNOSIS — J9811 Atelectasis: Secondary | ICD-10-CM | POA: Diagnosis not present

## 2022-09-16 DIAGNOSIS — R790 Abnormal level of blood mineral: Secondary | ICD-10-CM | POA: Insufficient documentation

## 2022-09-16 LAB — BASIC METABOLIC PANEL
Anion gap: 9 (ref 5–15)
BUN: 12 mg/dL (ref 8–23)
CO2: 26 mmol/L (ref 22–32)
Calcium: 9.4 mg/dL (ref 8.9–10.3)
Chloride: 108 mmol/L (ref 98–111)
Creatinine, Ser: 1 mg/dL (ref 0.44–1.00)
GFR, Estimated: 58 mL/min — ABNORMAL LOW (ref >60)
Glucose, Bld: 94 mg/dL (ref 70–99)
Potassium: 3.2 mmol/L — ABNORMAL LOW (ref 3.5–5.1)
Sodium: 143 mmol/L (ref 135–145)

## 2022-09-16 LAB — CBC
HCT: 32.6 % — ABNORMAL LOW (ref 36.0–46.0)
Hemoglobin: 10.7 g/dL — ABNORMAL LOW (ref 12.0–15.0)
MCH: 32.1 pg (ref 26.0–34.0)
MCHC: 32.8 g/dL (ref 30.0–36.0)
MCV: 97.9 fL (ref 80.0–100.0)
Platelets: 159 10*3/uL (ref 150–400)
RBC: 3.33 MIL/uL — ABNORMAL LOW (ref 3.87–5.11)
RDW: 12.8 % (ref 11.5–15.5)
WBC: 3.1 10*3/uL — ABNORMAL LOW (ref 4.0–10.5)
nRBC: 0 % (ref 0.0–0.2)

## 2022-09-16 LAB — BRAIN NATRIURETIC PEPTIDE: B Natriuretic Peptide: 150.5 pg/mL — ABNORMAL HIGH (ref 0.0–100.0)

## 2022-09-16 LAB — D-DIMER, QUANTITATIVE: D-Dimer, Quant: 1.04 ug/mL-FEU — ABNORMAL HIGH (ref 0.00–0.50)

## 2022-09-16 LAB — TROPONIN I (HIGH SENSITIVITY): Troponin I (High Sensitivity): 4 ng/L (ref <18)

## 2022-09-16 MED ORDER — POTASSIUM CHLORIDE CRYS ER 20 MEQ PO TBCR
40.0000 meq | EXTENDED_RELEASE_TABLET | Freq: Once | ORAL | Status: AC
  Administered 2022-09-16: 40 meq via ORAL
  Filled 2022-09-16: qty 2

## 2022-09-16 MED ORDER — IOHEXOL 350 MG/ML SOLN
100.0000 mL | Freq: Once | INTRAVENOUS | Status: AC | PRN
  Administered 2022-09-16: 80 mL via INTRAVENOUS

## 2022-09-16 NOTE — ED Triage Notes (Signed)
Pt arrived POV with SOB that started this am. Pt states she "feels like she has been running". +2 BLE swelling that has worsened over the last week. Recent weight gain with the extra fluid. Currently not seeing cardiology or pulmonology for any reason.

## 2022-09-16 NOTE — Discharge Instructions (Signed)
It was a pleasure caring for you today in the emergency department.  Your chest imaging did not show anything acutely concerning.  You do not have a blood clot in your lungs, or an active infection.  This is reassuring.  I placed a referral to cardiology.  They should call you to schedule.  You likely need further test for the swelling in your legs.  Please schedule an appointment with your primary doctor in the next 1 to 2 days for close follow-up. Please return to the emergency department for any worsening or worrisome symptoms.

## 2022-09-16 NOTE — ED Notes (Signed)
Pt given discharge instructions. Opportunities given for questions. Pt verbalizes understanding. PIV removed x1. Shyam Dawson R, RN 

## 2022-09-16 NOTE — ED Provider Notes (Signed)
[place] EMERGENCY DEPARTMENT AT [facility] Provider Note   CSN: [id number] Arrival date & time: 09/16/22  1884     History  Chief Complaint  Patient presents with   Shortness of Breath    Elizabeth Castro is a 77 y.o. female with a history of hypertension, CKD, osteoarthritis bilaterally here for shortness of breath that started acutely when she woke up this morning. She exercises 6 days a week (45 minutes of cardio) and does not have any dyspnea on exertion at baseline. Over the past few days, she has noticed that her lower legs have been swelling more than usual.  Her daughter says that sometimes she notices that her arms have swelling as well, but it comes and goes.  No chest pain or pressure.  No fevers, chills, cough.  No known sick contacts. Never smoker, has never been diagnosed with COPD or asthma.  Shortness of Breath Associated symptoms: no abdominal pain, no chest pain, no fever and no vomiting        Home Medications Prior to Admission medications   Medication Sig Start Date End Date Taking? Authorizing Provider  donepezil (ARICEPT) 5 MG tablet Take 5 mg by mouth at bedtime. 08/12/22  Yes [provider]  acetaminophen (TYLENOL) 500 MG tablet Take 2 tablets (1,000 mg total) by mouth every 8 (eight) hours as needed. 06/02/19   Georgetta Haber, NP  calcium carbonate (OS-CAL) 600 MG TABS Take 600 mg by mouth 2 (two) times daily with a meal.      [provider]  cholecalciferol (VITAMIN D) 1000 UNITS tablet Take 1,000 Units by mouth daily.    [provider]  hydrochlorothiazide (HYDRODIURIL) 25 MG tablet Take 25 mg by mouth daily.      [provider]  simvastatin (ZOCOR) 40 MG tablet Take 40 mg by mouth at bedtime.      [provider]  tiZANidine (ZANAFLEX) 2 MG tablet Take 1-2 tablets (2-4 mg total) by mouth every 8 (eight) hours as needed for muscle spasms. 06/02/19   Georgetta Haber, NP  traMADol (ULTRAM) 50  MG tablet 50 mg 3 (three) times daily as needed. 1-3 times daily as needed    [provider]  triamterene-hydrochlorothiazide (MAXZIDE-25) 37.5-25 MG tablet Take 0.5 tablets by mouth daily.    [provider]      Allergies    No known allergies    Review of Systems   Review of Systems  Constitutional:  Negative for chills and fever.  Respiratory:  Positive for shortness of breath.   Cardiovascular:  Positive for leg swelling. Negative for chest pain and palpitations.  Gastrointestinal:  Negative for abdominal pain, nausea and vomiting.  Musculoskeletal:  Positive for arthralgias.    Physical Exam Updated Vital Signs BP (!) 176/67 (BP Location: Right Arm)   Pulse (!) 59   Temp 98.1 F (36.7 C) (Oral)   Resp 19   Ht 5\' 3"  (1.6 m)   Wt 90.7 kg   SpO2 100%   BMI 35.43 kg/m  Physical Exam Constitutional:      General: She is not in acute distress.    Appearance: She is not ill-appearing.  HENT:     Mouth/Throat:     Mouth: Mucous membranes are moist.     Pharynx: Oropharynx is clear.  Eyes:     Extraocular Movements: Extraocular movements intact.     Pupils: Pupils are equal, round, and reactive to light.  Neck:  Vascular: No JVD.  Cardiovascular:     Rate and Rhythm: Normal rate.     Comments: Bradycardia, 40s-50s bpm Pulmonary:     Effort: Pulmonary effort is normal.     Breath sounds: Normal breath sounds.  Abdominal:     General: Bowel sounds are normal.     Palpations: Abdomen is soft.  Musculoskeletal:     Right lower leg: No tenderness. Edema (2+) present.     Left lower leg: No tenderness. Edema (2+) present.  Skin:    General: Skin is warm and dry.  Neurological:     General: No focal deficit present.     Mental Status: She is alert and oriented to person, place, and time.     ED Results / Procedures / Treatments   Labs (all labs ordered are listed, but only abnormal results are displayed) Labs Reviewed  CBC  BASIC METABOLIC  PANEL  BRAIN NATRIURETIC PEPTIDE    EKG None  Radiology No results found.  Procedures Procedures    Medications Ordered in ED Medications - No data to display  ED Course/ Medical Decision Making/ A&P                             Medical Decision Making Patient is a 77 year old female here for acute onset dyspnea and lower extremity swelling.  Well-appearing on exam without tachypnea or acute pulmonic findings and maintained SpO2 95-100% on room air. Symptoms most consistent with CHF, which has not been diagnosed.  BNP elevated to 150.5.  Considered the differentials as follows: MI unlikely; troponin 4.  No consolidations concerning for pneumonia seen on imaging, and patient does not have any other URI symptoms.   Given elevated D-dimer 1.04, obtained CTA PE which was negative for pulmonary embolism but did show small pleural effusion with significant calcified coronary artery plaque present  Given clinical stability, patient appropriate for outpatient management.  Urgent cardiology consult placed at discharge for workup of dyspnea and lower extremity edema and CAD. Also recommend follow-up for small pleural effusion. Patient discharged home in stable condition.  All questions answered and return precautions discussed.   Amount and/or Complexity of Data Reviewed Labs: ordered. Radiology: ordered. ECG/medicine tests: ordered.  Risk Prescription drug management.  D-dimer, troponin  Underlying airflow obstruction (asthma, COPD) MI, arrythmia, Pneumonia, SOB, CHF, PE, anemia, ARDS        Final Clinical Impression(s) / ED Diagnoses Final diagnoses:  None    Rx / DC Orders ED Discharge Orders     None         Darral Dash, DO 09/16/22 1209    Blane Ohara, MD 09/16/22 1558

## 2022-09-19 DIAGNOSIS — I509 Heart failure, unspecified: Secondary | ICD-10-CM | POA: Diagnosis not present

## 2022-09-19 DIAGNOSIS — Z79899 Other long term (current) drug therapy: Secondary | ICD-10-CM | POA: Diagnosis not present

## 2022-09-19 DIAGNOSIS — I10 Essential (primary) hypertension: Secondary | ICD-10-CM | POA: Diagnosis not present

## 2022-09-19 DIAGNOSIS — E785 Hyperlipidemia, unspecified: Secondary | ICD-10-CM | POA: Diagnosis not present

## 2022-09-19 DIAGNOSIS — Z114 Encounter for screening for human immunodeficiency virus [HIV]: Secondary | ICD-10-CM | POA: Diagnosis not present

## 2022-09-19 DIAGNOSIS — Z0000 Encounter for general adult medical examination without abnormal findings: Secondary | ICD-10-CM | POA: Diagnosis not present

## 2022-09-19 DIAGNOSIS — Z1159 Encounter for screening for other viral diseases: Secondary | ICD-10-CM | POA: Diagnosis not present

## 2022-09-19 DIAGNOSIS — I700 Atherosclerosis of aorta: Secondary | ICD-10-CM | POA: Diagnosis not present

## 2022-09-19 DIAGNOSIS — Z136 Encounter for screening for cardiovascular disorders: Secondary | ICD-10-CM | POA: Diagnosis not present

## 2022-09-19 DIAGNOSIS — E663 Overweight: Secondary | ICD-10-CM | POA: Diagnosis not present

## 2022-09-21 DIAGNOSIS — I10 Essential (primary) hypertension: Secondary | ICD-10-CM | POA: Diagnosis not present

## 2022-09-21 DIAGNOSIS — F0390 Unspecified dementia without behavioral disturbance: Secondary | ICD-10-CM | POA: Diagnosis not present

## 2022-09-21 DIAGNOSIS — R600 Localized edema: Secondary | ICD-10-CM | POA: Diagnosis not present

## 2022-09-21 DIAGNOSIS — N1831 Chronic kidney disease, stage 3a: Secondary | ICD-10-CM | POA: Diagnosis not present

## 2022-09-21 DIAGNOSIS — I509 Heart failure, unspecified: Secondary | ICD-10-CM | POA: Diagnosis not present

## 2022-09-29 NOTE — Progress Notes (Deleted)
Cardiology Office Note:    Date:  09/29/2022   ID:  Elizabeth Castro, DOB 1945-09-08, MRN [id number]  PCP:  Georgann Housekeeper, MD   [facility] Providers Cardiologist:  None { Click to update primary MD,subspecialty MD or APP then REFRESH:1}    Referring MD: Georgann Housekeeper, MD   No chief complaint on file. ***  History of Present Illness:    Elizabeth Castro is a 77 y.o. female seen at the request of Dr Donette Larry for evaluation of SOB, leg edema and pleural effusion. He has a history of HTN,  HLD, and varicose veins. Apparently seen remotely by cardiology. Last evaluation with [facility] in 2017 was normal. He was seen recently in ED with SOB. CXR showed right basilar atx. CT chest was negative for PE. Small right effusion. Significant coronary calcification. Troponin negative. BNP 150. Was DC for outpatient follow up.   Past Medical History:  Diagnosis Date   Family history of ovarian cancer    High cholesterol    Hypertension     Past Surgical History:  Procedure Laterality Date   BREAST SURGERY     LEFT BREAST BIOPSY   COLONOSCOPY W/ POLYPECTOMY  2002   BENIGN   ENDOMETRIAL ABLATION  1997   RESECTOSCOPIC POLYPECTOMY   TUBAL LIGATION  1977    Current Medications: No outpatient medications have been marked as taking for the 10/03/22 encounter (Appointment) with [place],  M, MD.     Allergies:   No known allergies   Social History   Socioeconomic History   Marital status: Married    Spouse name: Not on file   Number of children: Not on file   Years of education: Not on file   Highest education level: Not on file  Occupational History   Not on file  Tobacco Use   Smoking status: Never   Smokeless tobacco: Never  Vaping Use   Vaping Use: Never used  Substance and Sexual Activity   Alcohol use: Yes    Alcohol/week: 0.0 standard drinks of alcohol    Comment: wine    Drug use: No   Sexual activity: Not Currently    Partners: Male    Birth  control/protection: Post-menopausal    Comment: 1st intercorse- 16, married- 54 yrs   Other Topics Concern   Not on file  Social History Narrative   Not on file   Social Determinants of Health   Financial Resource Strain: Not on file  Food Insecurity: Not on file  Transportation Needs: Not on file  Physical Activity: Not on file  Stress: Not on file  Social Connections: Not on file     Family History: The patient's ***family history includes Ovarian cancer (age of onset: 12) in her mother.  ROS:   Please see the history of present illness.    *** All other systems reviewed and are negative.  EKGs/Labs/Other Studies Reviewed:    The following studies were reviewed today: [facility] 06/25/15: Study Highlights  Nuclear stress EF: 63%. No T wave inversion was noted during stress. There was no ST segment deviation noted during stress. This is a low risk study.   Normal perfusion. LVEF 63% with normal wall motion. This is a low risk study.     CT chest 09/16/22: IMPRESSION: 1. No evidence of pulmonary embolism. 2. Small right pleural effusion. 3. Significant calcified coronary artery plaque present. 4. Top-normal heart size. 5. Atherosclerosis of the thoracic aorta without aneurysmal disease.  Recent Labs: 09/16/2022: B Natriuretic Peptide  150.5; BUN 12; Creatinine, Ser 1.00; Hemoglobin 10.7; Platelets 159; Potassium 3.2; Sodium 143  Recent Lipid Panel No results found for: "CHOL", "TRIG", "HDL", "CHOLHDL", "VLDL", "LDLCALC", "LDLDIRECT"  Labs dated 01/25/22: A1c 5.2%. cholesterol 214, triglycerides 100, HDL 80, LDL 117. CMET and TSH normal.   Risk Assessment/Calculations:   {Does this patient have ATRIAL FIBRILLATION?[phone]}  No BP recorded.  {Refresh Note OR Click here to enter BP  :1}***         Physical Exam:    VS:  There were no vitals taken for this visit.    Wt Readings from Last 3 Encounters:  09/16/22 200 lb (90.7 kg)  06/05/18 156 lb 9.6 oz (71 kg)   04/10/17 166 lb (75.3 kg)     GEN: *** Well nourished, well developed in no acute distress HEENT: Normal NECK: No JVD; No carotid bruits LYMPHATICS: No lymphadenopathy CARDIAC: ***RRR, no murmurs, rubs, gallops RESPIRATORY:  Clear to auscultation without rales, wheezing or rhonchi  ABDOMEN: Soft, non-tender, non-distended MUSCULOSKELETAL:  No edema; No deformity  SKIN: Warm and dry NEUROLOGIC:  Alert and oriented x 3 PSYCHIATRIC:  Normal affect   ASSESSMENT:    No diagnosis found. PLAN:    In order of problems listed above:  ***      {Are you ordering a CV Procedure (e.g. stress test, cath, DCCV, TEE, etc)?   Press F2        :[id number]}    Medication Adjustments/Labs and Tests Ordered: Current medicines are reviewed at length with the patient today.  Concerns regarding medicines are outlined above.  No orders of the defined types were placed in this encounter.  No orders of the defined types were placed in this encounter.   There are no Patient Instructions on file for this visit.   Signed,  [place], MD  09/29/2022 10:15 AM    [facility]

## 2022-10-03 ENCOUNTER — Encounter: Payer: Self-pay | Admitting: Cardiology

## 2022-10-03 ENCOUNTER — Ambulatory Visit: Payer: Medicare HMO | Attending: Cardiology | Admitting: Cardiology

## 2022-10-04 DIAGNOSIS — R600 Localized edema: Secondary | ICD-10-CM | POA: Diagnosis not present

## 2022-10-11 ENCOUNTER — Ambulatory Visit: Payer: Medicare HMO | Attending: Cardiology

## 2022-10-11 ENCOUNTER — Ambulatory Visit: Payer: Medicare HMO | Attending: Cardiology | Admitting: Cardiology

## 2022-10-11 VITALS — BP 122/68 | HR 62 | Ht 64.0 in | Wt 152.4 lb

## 2022-10-11 DIAGNOSIS — R0609 Other forms of dyspnea: Secondary | ICD-10-CM

## 2022-10-11 DIAGNOSIS — R0602 Shortness of breath: Secondary | ICD-10-CM

## 2022-10-11 DIAGNOSIS — R42 Dizziness and giddiness: Secondary | ICD-10-CM

## 2022-10-11 DIAGNOSIS — R0989 Other specified symptoms and signs involving the circulatory and respiratory systems: Secondary | ICD-10-CM

## 2022-10-11 NOTE — Patient Instructions (Addendum)
Medication Instructions:  Your physician recommends that you continue on your current medications as directed. Please refer to the Current Medication list given to you today.  *If you need a refill on your cardiac medications before your next appointment, please call your pharmacy*   Lab Work: None needed  If you have labs (blood work) drawn today and your tests are completely normal, you will receive your results only by: MyChart Message (if you have MyChart) OR A paper copy in the mail If you have any lab test that is abnormal or we need to change your treatment, we will call you to review the results.   Testing/Procedures: Your physician has requested that you have an echocardiogram. Echocardiography is a painless test that uses sound waves to create images of your heart. It provides your doctor with information about the size and shape of your heart and how well your heart's chambers and valves are working. This procedure takes approximately one hour. There are no restrictions for this procedure. Please do NOT wear cologne, perfume, aftershave, or lotions (deodorant is allowed). Please arrive 15 minutes prior to your appointment time.  ZIO XT- Long Term Monitor Instructions  Your physician has requested you wear a ZIO patch monitor for 14 days.  This is a single patch monitor. [employer] supplies one patch monitor per enrollment. Additional stickers are not available. Please do not apply patch if you will be having a Nuclear Stress Test,  Echocardiogram, Cardiac CT, MRI, or Chest Xray during the period you would be wearing the  monitor. The patch cannot be worn during these tests. You cannot remove and re-apply the  ZIO XT patch monitor.  Your ZIO patch monitor will be mailed 3 day USPS to your address on file. It may take 3-5 days  to receive your monitor after you have been enrolled.  Once you have received your monitor, please review the enclosed instructions. Your monitor  has  already been registered assigning a specific monitor serial # to you.  Billing and Patient Assistance Program Information  We have supplied [employer] with any of your insurance information on file for billing purposes. [employer] offers a sliding scale Patient Assistance Program for patients that do not have  insurance, or whose insurance does not completely cover the cost of the ZIO monitor.  You must apply for the Patient Assistance Program to qualify for this discounted rate.  To apply, please call [employer] at [phone], select option 4, select option 2, ask to apply for  Patient Assistance Program. Meredeth Ide will ask your household income, and how many people  are in your household. They will quote your out-of-pocket cost based on that information.  [employer] will also be able to set up a 13-month, interest-free payment plan if needed.  Applying the monitor   Shave hair from upper left chest.  Hold abrader disc by orange tab. Rub abrader in 40 strokes over the upper left chest as  indicated in your monitor instructions.  Clean area with 4 enclosed alcohol pads. Let dry.  Apply patch as indicated in monitor instructions. Patch will be placed under collarbone on left  side of chest with arrow pointing upward.  Rub patch adhesive wings for 2 minutes. Remove white label marked "1". Remove the white  label marked "2". Rub patch adhesive wings for 2 additional minutes.  While looking in a mirror, press and release button in center of patch. A small green light will  flash 3-4 times. This will be your only  indicator that the monitor has been turned on.  Do not shower for the first 24 hours. You may shower after the first 24 hours.  Press the button if you feel a symptom. You will hear a small click. Record Date, Time and  Symptom in the Patient Logbook.  When you are ready to remove the patch, follow instructions on the last 2 pages of Patient  Logbook. Stick patch monitor onto the last page of  Patient Logbook.  Place Patient Logbook in the blue and white box. Use locking tab on box and tape box closed  securely. The blue and white box has prepaid postage on it. Please place it in the mailbox as  soon as possible. Your physician should have your test results approximately 7 days after the  monitor has been mailed back to [facility].  Call [facility] Customer Care at [phone] if you have questions regarding  your ZIO XT patch monitor. Call them immediately if you see an orange light blinking on your  monitor.  If your monitor falls off in less than 4 days, contact our Monitor department at [phone].  If your monitor becomes loose or falls off after 4 days call [employer] at [phone] for  suggestions on securing your monitor    Follow-Up: At [facility], you and your health needs are our priority.  As part of our continuing mission to provide you with exceptional heart care, we have created designated Provider Care Teams.  These Care Teams include your primary Cardiologist (physician) and Advanced Practice Providers (APPs -  Physician Assistants and Nurse Practitioners) who all work together to provide you with the care you need, when you need it.  We recommend signing up for the patient portal called "MyChart".  Sign up information is provided on this After Visit Summary.  MyChart is used to connect with patients for Virtual Visits (Telemedicine).  Patients are able to view lab/test results, encounter notes, upcoming appointments, etc.  Non-urgent messages can be sent to your provider as well.   To learn more about what you can do with MyChart, go to [email or web address].    Your next appointment:   4 month(s)  Provider:   Thomasene Ripple, DO

## 2022-10-11 NOTE — Progress Notes (Unsigned)
Enrolled for [employer] to mail a ZIO XT long term holter monitor to the patients address on file.  

## 2022-10-11 NOTE — Progress Notes (Signed)
Cardiology Office Note:    Date:  10/11/2022   ID:  Elizabeth Castro, DOB 1945/11/14, MRN [id number]  PCP:  Georgann Housekeeper, MD  Cardiologist:  Thomasene Ripple, DO  Electrophysiologist:  None   Referring MD: Georgann Housekeeper, MD   " I had shortness of breath"  History of Present Illness:    Elizabeth Castro is a 77 y.o. female with a hx of hyperlipidemia, hypertension here today to be evaluated for shortness of breath, intermittent lightheadedness and increasing lower extremity edema and foot discoloration.  She is here today with her daughter she tells me that recently she woke up with significant shortness of breath and exertion.  She is concerned about this.  She called her daughter.  Shortly thereafter she did see her PCP who noticed lower extremity edema started her on triamterene added to her hydrochlorothiazide.  This has improved but she notes that she has had intermittent lightheadedness.  Lower extremity discoloration is worsening.  No other complaints at this time.  Past Medical History:  Diagnosis Date   Family history of ovarian cancer    High cholesterol    Hypertension     Past Surgical History:  Procedure Laterality Date   BREAST SURGERY     LEFT BREAST BIOPSY   COLONOSCOPY W/ POLYPECTOMY  2002   BENIGN   ENDOMETRIAL ABLATION  1997   RESECTOSCOPIC POLYPECTOMY   TUBAL LIGATION  1977    Current Medications: Current Meds  Medication Sig   acetaminophen (TYLENOL) 500 MG tablet Take 2 tablets (1,000 mg total) by mouth every 8 (eight) hours as needed.   cholecalciferol (VITAMIN D) 1000 UNITS tablet Take 1,000 Units by mouth daily.   donepezil (ARICEPT) 5 MG tablet Take 5 mg by mouth at bedtime.   furosemide (LASIX) 40 MG tablet Take 40 mg by mouth every morning. 1/2 tab   hydrochlorothiazide (HYDRODIURIL) 25 MG tablet Take 25 mg by mouth daily.     potassium chloride (KLOR-CON) 10 MEQ tablet Take 20 mEq by mouth daily.   simvastatin (ZOCOR) 40 MG tablet Take 40 mg by  mouth at bedtime.     tiZANidine (ZANAFLEX) 2 MG tablet Take 1-2 tablets (2-4 mg total) by mouth every 8 (eight) hours as needed for muscle spasms.   traMADol (ULTRAM) 50 MG tablet 50 mg 3 (three) times daily as needed. 1-3 times daily as needed     Allergies:   No known allergies   Social History   Socioeconomic History   Marital status: Married    Spouse name: Not on file   Number of children: Not on file   Years of education: Not on file   Highest education level: Not on file  Occupational History   Not on file  Tobacco Use   Smoking status: Never   Smokeless tobacco: Never  Vaping Use   Vaping status: Never Used  Substance and Sexual Activity   Alcohol use: Yes    Alcohol/week: 0.0 standard drinks of alcohol    Comment: wine    Drug use: No   Sexual activity: Not Currently    Partners: Male    Birth control/protection: Post-menopausal    Comment: 1st intercorse- 16, married- 54 yrs   Other Topics Concern   Not on file  Social History Narrative   Not on file   Social Determinants of Health   Financial Resource Strain: Not on file  Food Insecurity: Not on file  Transportation Needs: Not on file  Physical Activity: Not on file  Stress: Not on file  Social Connections: Not on file     Family History: The patient's family history includes Ovarian cancer (age of onset: 10) in her mother.  ROS:   Review of Systems  Constitution: Negative for decreased appetite, fever and weight gain.  HENT: Negative for congestion, ear discharge, hoarse voice and sore throat.   Eyes: Negative for discharge, redness, vision loss in right eye and visual halos.  Cardiovascular: Report palpitations, leg swelling and dyspnea exertion.  Negative for chest pain. Respiratory: Negative for cough, hemoptysis, shortness of breath and snoring.   Endocrine: Negative for heat intolerance and polyphagia.  Hematologic/Lymphatic: Negative for bleeding problem. Does not bruise/bleed easily.   Skin: Negative for flushing, nail changes, rash and suspicious lesions.  Musculoskeletal: Negative for arthritis, joint pain, muscle cramps, myalgias, neck pain and stiffness.  Gastrointestinal: Negative for abdominal pain, bowel incontinence, diarrhea and excessive appetite.  Genitourinary: Negative for decreased libido, genital sores and incomplete emptying.  Neurological: Negative for brief paralysis, focal weakness, headaches and loss of balance.  Psychiatric/Behavioral: Negative for altered mental status, depression and suicidal ideas.  Allergic/Immunologic: Negative for HIV exposure and persistent infections.    EKGs/Labs/Other Studies Reviewed:    The following studies were reviewed today:   EKG:  The ekg ordered today demonstrates sinus rhythm, heart rate 62 bpm.  Nuclear stress test in 2017 was normal.  Recent Labs: 09/16/2022: B Natriuretic Peptide 150.5; BUN 12; Creatinine, Ser 1.00; Hemoglobin 10.7; Platelets 159; Potassium 3.2; Sodium 143  Recent Lipid Panel No results found for: "CHOL", "TRIG", "HDL", "CHOLHDL", "VLDL", "LDLCALC", "LDLDIRECT"  Physical Exam:    VS:  BP 122/68   Pulse 62   Ht 5\' 4"  (1.626 m)   Wt 152 lb 6.4 oz (69.1 kg)   SpO2 98%   BMI 26.16 kg/m     Wt Readings from Last 3 Encounters:  10/11/22 152 lb 6.4 oz (69.1 kg)  09/16/22 200 lb (90.7 kg)  06/05/18 156 lb 9.6 oz (71 kg)     GEN: Well nourished, well developed in no acute distress HEENT: Normal NECK: No JVD; No carotid bruits LYMPHATICS: No lymphadenopathy CARDIAC: S1S2 noted,RRR, no murmurs, rubs, gallops RESPIRATORY:  Clear to auscultation without rales, wheezing or rhonchi  ABDOMEN: Soft, non-tender, non-distended, +bowel sounds, no guarding. EXTREMITIES: No edema, No cyanosis, no clubbing MUSCULOSKELETAL:  No deformity  SKIN: Warm and dry NEUROLOGIC:  Alert and oriented x 3, non-focal PSYCHIATRIC:  Normal affect, good insight  ASSESSMENT:    1. SOB (shortness of breath)    2. Dyspnea on exertion   3. Lightheadedness   4. Decreased pedal pulses    PLAN:     Dyspnea on exertion leg edema get an echocardiogram to assess for any structural abnormalities.  It seems that she is improved with the diuretics. On palpation diminished bilateral dorsalis pedis as well as posterior tibial pulses will get arterial Doppler. Will place the 14-day ZIO on the patient to rule out any arrhythmia given her lightheadedness  The patient is in agreement with the above plan. The patient left the office in stable condition.  The patient will follow up in   Medication Adjustments/Labs and Tests Ordered: Current medicines are reviewed at length with the patient today.  Concerns regarding medicines are outlined above.  Orders Placed This Encounter  Procedures   LONG TERM MONITOR (3-14 DAYS)   EKG 12-Lead   ECHOCARDIOGRAM COMPLETE   VAS [place] LOWER EXTREMITY ARTERIAL DUPLEX   No orders of the  defined types were placed in this encounter.   Patient Instructions  Medication Instructions:  Your physician recommends that you continue on your current medications as directed. Please refer to the Current Medication list given to you today.  *If you need a refill on your cardiac medications before your next appointment, please call your pharmacy*   Lab Work: None needed  If you have labs (blood work) drawn today and your tests are completely normal, you will receive your results only by: MyChart Message (if you have MyChart) OR A paper copy in the mail If you have any lab test that is abnormal or we need to change your treatment, we will call you to review the results.   Testing/Procedures: Your physician has requested that you have an echocardiogram. Echocardiography is a painless test that uses sound waves to create images of your heart. It provides your doctor with information about the size and shape of your heart and how well your heart's chambers and valves are working. This  procedure takes approximately one hour. There are no restrictions for this procedure. Please do NOT wear cologne, perfume, aftershave, or lotions (deodorant is allowed). Please arrive 15 minutes prior to your appointment time.  ZIO XT- Long Term Monitor Instructions  Your physician has requested you wear a ZIO patch monitor for 14 days.  This is a single patch monitor. [employer] supplies one patch monitor per enrollment. Additional stickers are not available. Please do not apply patch if you will be having a Nuclear Stress Test,  Echocardiogram, Cardiac CT, MRI, or Chest Xray during the period you would be wearing the  monitor. The patch cannot be worn during these tests. You cannot remove and re-apply the  ZIO XT patch monitor.  Your ZIO patch monitor will be mailed 3 day USPS to your address on file. It may take 3-5 days  to receive your monitor after you have been enrolled.  Once you have received your monitor, please review the enclosed instructions. Your monitor  has already been registered assigning a specific monitor serial # to you.  Billing and Patient Assistance Program Information  We have supplied [employer] with any of your insurance information on file for billing purposes. [employer] offers a sliding scale Patient Assistance Program for patients that do not have  insurance, or whose insurance does not completely cover the cost of the ZIO monitor.  You must apply for the Patient Assistance Program to qualify for this discounted rate.  To apply, please call [employer] at [phone], select option 4, select option 2, ask to apply for  Patient Assistance Program. Meredeth Ide will ask your household income, and how many people  are in your household. They will quote your out-of-pocket cost based on that information.  [employer] will also be able to set up a 23-month, interest-free payment plan if needed.  Applying the monitor   Shave hair from upper left chest.  Hold abrader disc by orange  tab. Rub abrader in 40 strokes over the upper left chest as  indicated in your monitor instructions.  Clean area with 4 enclosed alcohol pads. Let dry.  Apply patch as indicated in monitor instructions. Patch will be placed under collarbone on left  side of chest with arrow pointing upward.  Rub patch adhesive wings for 2 minutes. Remove white label marked "1". Remove the white  label marked "2". Rub patch adhesive wings for 2 additional minutes.  While looking in a mirror, press and release button in center of patch. A  small green light will  flash 3-4 times. This will be your only indicator that the monitor has been turned on.  Do not shower for the first 24 hours. You may shower after the first 24 hours.  Press the button if you feel a symptom. You will hear a small click. Record Date, Time and  Symptom in the Patient Logbook.  When you are ready to remove the patch, follow instructions on the last 2 pages of Patient  Logbook. Stick patch monitor onto the last page of Patient Logbook.  Place Patient Logbook in the blue and white box. Use locking tab on box and tape box closed  securely. The blue and white box has prepaid postage on it. Please place it in the mailbox as  soon as possible. Your physician should have your test results approximately 7 days after the  monitor has been mailed back to [facility].  Call [facility] Customer Care at [phone] if you have questions regarding  your ZIO XT patch monitor. Call them immediately if you see an orange light blinking on your  monitor.  If your monitor falls off in less than 4 days, contact our Monitor department at [phone].  If your monitor becomes loose or falls off after 4 days call [employer] at [phone] for  suggestions on securing your monitor    Follow-Up: At [facility], you and your health needs are our priority.  As part of our continuing mission to provide you with exceptional heart care, we have  created designated Provider Care Teams.  These Care Teams include your primary Cardiologist (physician) and Advanced Practice Providers (APPs -  Physician Assistants and Nurse Practitioners) who all work together to provide you with the care you need, when you need it.  We recommend signing up for the patient portal called "MyChart".  Sign up information is provided on this After Visit Summary.  MyChart is used to connect with patients for Virtual Visits (Telemedicine).  Patients are able to view lab/test results, encounter notes, upcoming appointments, etc.  Non-urgent messages can be sent to your provider as well.   To learn more about what you can do with MyChart, go to [email or web address].    Your next appointment:   4 month(s)  Provider:   Thomasene Ripple, DO    Adopting a Healthy Lifestyle.  Know what a healthy weight is for you (roughly BMI <25) and aim to maintain this   Aim for 7+ servings of fruits and vegetables daily   65-80+ fluid ounces of water or unsweet tea for healthy kidneys   Limit to max 1 drink of alcohol per day; avoid smoking/tobacco   Limit animal fats in diet for cholesterol and heart health - choose grass fed whenever available   Avoid highly processed foods, and foods high in saturated/trans fats   Aim for low stress - take time to unwind and care for your mental health   Aim for 150 min of moderate intensity exercise weekly for heart health, and weights twice weekly for bone health   Aim for 7-9 hours of sleep daily   When it comes to diets, agreement about the perfect plan isnt easy to find, even among the experts. Experts at the [facility] [employer] developed an idea known as the Healthy Eating Plate. Just imagine a plate divided into logical, healthy portions.   The emphasis is on diet quality:   Load up on vegetables and fruits - one-half of your plate: Aim for  color and variety, and remember that potatoes dont count.   Go for whole  grains - one-quarter of your plate: Whole wheat, barley, wheat berries, quinoa, oats, brown rice, and foods made with them. If you want pasta, go with whole wheat pasta.   Protein power - one-quarter of your plate: Fish, chicken, beans, and nuts are all healthy, versatile protein sources. Limit red meat.   The diet, however, does go beyond the plate, offering a few other suggestions.   Use healthy plant oils, such as olive, canola, soy, corn, sunflower and peanut. Check the labels, and avoid partially hydrogenated oil, which have unhealthy trans fats.   If youre thirsty, drink water. Coffee and tea are good in moderation, but skip sugary drinks and limit milk and dairy products to one or two daily servings.   The type of carbohydrate in the diet is more important than the amount. Some sources of carbohydrates, such as vegetables, fruits, whole grains, and beans-are healthier than others.   Finally, stay active  Signed, Thomasene Ripple, DO  10/11/2022 3:20 PM    [facility]

## 2022-10-13 ENCOUNTER — Other Ambulatory Visit (HOSPITAL_COMMUNITY): Payer: Self-pay | Admitting: Cardiology

## 2022-10-13 DIAGNOSIS — R42 Dizziness and giddiness: Secondary | ICD-10-CM

## 2022-10-13 DIAGNOSIS — R0989 Other specified symptoms and signs involving the circulatory and respiratory systems: Secondary | ICD-10-CM

## 2022-10-19 ENCOUNTER — Ambulatory Visit (HOSPITAL_COMMUNITY)
Admission: RE | Admit: 2022-10-19 | Discharge: 2022-10-19 | Disposition: A | Discharge status: Home / Self Care | Payer: Medicare HMO | Source: Ambulatory Visit | Attending: Cardiology | Admitting: Cardiology

## 2022-10-19 DIAGNOSIS — R0989 Other specified symptoms and signs involving the circulatory and respiratory systems: Secondary | ICD-10-CM | POA: Insufficient documentation

## 2022-11-04 ENCOUNTER — Ambulatory Visit (HOSPITAL_COMMUNITY): Payer: Medicare HMO | Attending: Cardiology

## 2022-11-04 DIAGNOSIS — I361 Nonrheumatic tricuspid (valve) insufficiency: Secondary | ICD-10-CM

## 2022-11-04 DIAGNOSIS — R0609 Other forms of dyspnea: Secondary | ICD-10-CM | POA: Insufficient documentation

## 2022-11-04 DIAGNOSIS — R42 Dizziness and giddiness: Secondary | ICD-10-CM | POA: Diagnosis not present

## 2022-11-04 LAB — ECHOCARDIOGRAM COMPLETE
Area-P 1/2: 3.37 cm2
S' Lateral: 2.5 cm

## 2022-11-08 ENCOUNTER — Ambulatory Visit: Payer: Medicare HMO | Admitting: Podiatry

## 2022-11-08 DIAGNOSIS — M778 Other enthesopathies, not elsewhere classified: Secondary | ICD-10-CM

## 2022-11-29 ENCOUNTER — Ambulatory Visit: Payer: Medicare HMO | Admitting: Podiatry

## 2022-12-06 ENCOUNTER — Ambulatory Visit: Payer: Medicare HMO | Admitting: Podiatry

## 2022-12-06 ENCOUNTER — Encounter: Payer: Self-pay | Admitting: Podiatry

## 2022-12-06 DIAGNOSIS — R141 Gas pain: Secondary | ICD-10-CM | POA: Insufficient documentation

## 2022-12-06 DIAGNOSIS — K648 Other hemorrhoids: Secondary | ICD-10-CM | POA: Insufficient documentation

## 2022-12-06 DIAGNOSIS — Z860100 Personal history of colon polyps, unspecified: Secondary | ICD-10-CM | POA: Insufficient documentation

## 2022-12-06 DIAGNOSIS — M778 Other enthesopathies, not elsewhere classified: Secondary | ICD-10-CM

## 2022-12-06 DIAGNOSIS — K5900 Constipation, unspecified: Secondary | ICD-10-CM | POA: Insufficient documentation

## 2022-12-06 DIAGNOSIS — K641 Second degree hemorrhoids: Secondary | ICD-10-CM | POA: Insufficient documentation

## 2022-12-06 DIAGNOSIS — I872 Venous insufficiency (chronic) (peripheral): Secondary | ICD-10-CM

## 2022-12-07 NOTE — Progress Notes (Signed)
Subjective:  Patient ID: Elizabeth Castro, female    DOB: 1946-01-01,  MRN: [id number] HPI Chief Complaint  Patient presents with   Ankle Pain    Ankle bilateral - evaluate for swelling and possible need for compression stockings, also having discoloration in toes and ankles, seen vascular for arterial studies (normal), no pain   New Patient (Initial Visit)    77 y.o. female presents with the above complaint.   ROS: Denies fever chills nausea vomit muscle aches pains calf pain back pain chest pain shortness of breath.  Past Medical History:  Diagnosis Date   Family history of ovarian cancer    High cholesterol    Hypertension    Past Surgical History:  Procedure Laterality Date   BREAST SURGERY     LEFT BREAST BIOPSY   COLONOSCOPY W/ POLYPECTOMY  2002   BENIGN   ENDOMETRIAL ABLATION  1997   RESECTOSCOPIC POLYPECTOMY   TUBAL LIGATION  1977    Current Outpatient Medications:    acetaminophen (TYLENOL) 500 MG tablet, Take 2 tablets (1,000 mg total) by mouth every 8 (eight) hours as needed., Disp: 30 tablet, Rfl: 0   cholecalciferol (VITAMIN D) 1000 UNITS tablet, Take 1,000 Units by mouth daily., Disp: , Rfl:    donepezil (ARICEPT) 5 MG tablet, Take 5 mg by mouth at bedtime., Disp: , Rfl:    furosemide (LASIX) 40 MG tablet, Take 40 mg by mouth every morning. 1/2 tab, Disp: , Rfl:    hydrochlorothiazide (HYDRODIURIL) 25 MG tablet, Take 25 mg by mouth daily.  , Disp: , Rfl:    potassium chloride (KLOR-CON) 10 MEQ tablet, Take 20 mEq by mouth daily., Disp: , Rfl:    simvastatin (ZOCOR) 40 MG tablet, Take 40 mg by mouth at bedtime.  , Disp: , Rfl:    tiZANidine (ZANAFLEX) 2 MG tablet, Take 1-2 tablets (2-4 mg total) by mouth every 8 (eight) hours as needed for muscle spasms., Disp: 20 tablet, Rfl: 0   traMADol (ULTRAM) 50 MG tablet, 50 mg 3 (three) times daily as needed. 1-3 times daily as needed, Disp: , Rfl:    triamterene-hydrochlorothiazide (MAXZIDE-25) 37.5-25 MG tablet, Take  0.5 tablets by mouth daily. (Patient not taking: Reported on 10/11/2022), Disp: , Rfl:   Allergies  Allergen Reactions   No Known Allergies    Review of Systems Objective:  There were no vitals filed for this visit.  General: Well developed, nourished, in no acute distress, alert and oriented x3   Dermatological: Skin is warm, dry and supple bilateral. Nails x 10 are well maintained; remaining integument appears unremarkable at this time. There are no open sores, no preulcerative lesions, no rash or signs of infection present.  Hyperpigmentation around the ankles most likely secondary to longstanding edema  Vascular: Dorsalis Pedis artery and Posterior Tibial artery pedal pulses are 2/4 bilateral with immedate capillary fill time. Pedal hair growth present. No varicosities and she has lower extremity edema pitting in nature around the ankles and the dorsum of the foot which resolves at nighttime while her feet are elevated in bed.  Neruologic: Grossly intact via light touch bilateral. Vibratory intact via tuning fork bilateral. Protective threshold with Semmes Wienstein monofilament intact to all pedal sites bilateral. Patellar and Achilles deep tendon reflexes 2+ bilateral. No Babinski or clonus noted bilateral.   Musculoskeletal: No gross boney pedal deformities bilateral. No pain, crepitus, or limitation noted with foot and ankle range of motion bilateral. Muscular strength 5/5 in all groups tested bilateral.  Gait: Unassisted, Nonantalgic.    Radiographs:  None taken  Assessment & Plan:   Assessment: Bilaterally lower extremity edema  Plan: After having reviewed venous studies previous ABIs echo cardiograms it appears that this is a simple venous insufficiency or even just moderate edema of unknown origin.  I recommended compression hose and socks prior to her getting out of bed in the mornings and elevation anytime she is sitting.  If they become painful or a wound develops she is  to notify us immediately.     Draken Farrior T. [place], [place]

## 2022-12-12 ENCOUNTER — Ambulatory Visit: Payer: Medicare HMO | Attending: Cardiology | Admitting: Cardiology

## 2022-12-12 VITALS — Ht 65.0 in | Wt 155.0 lb

## 2022-12-12 DIAGNOSIS — I10 Essential (primary) hypertension: Secondary | ICD-10-CM | POA: Diagnosis not present

## 2022-12-12 DIAGNOSIS — I471 Supraventricular tachycardia, unspecified: Secondary | ICD-10-CM

## 2022-12-12 DIAGNOSIS — I491 Atrial premature depolarization: Secondary | ICD-10-CM | POA: Diagnosis not present

## 2022-12-12 MED ORDER — METOPROLOL SUCCINATE ER 25 MG PO TB24: 12.5000 mg | ORAL_TABLET | Freq: Every day | ORAL | 3 refills | Status: AC

## 2022-12-12 NOTE — Patient Instructions (Addendum)
Medication Instructions:  Your physician has recommended you make the following change in your medication: START: Metoprolol Succinate (Toprol-XL)  12.5 mg once daily *If you need a refill on your cardiac medications before your next appointment, please call your pharmacy*   Lab Work: None    Testing/Procedures: None   Follow-Up: At [facility], you and your health needs are our priority.  As part of our continuing mission to provide you with exceptional heart care, we have created designated Provider Care Teams.  These Care Teams include your primary Cardiologist (physician) and Advanced Practice Providers (APPs -  Physician Assistants and Nurse Practitioners) who all work together to provide you with the care you need, when you need it.  Your next appointment:   6 month(s)  Provider:   Thomasene Ripple, DO

## 2022-12-12 NOTE — Progress Notes (Signed)
Virtual Visit via Video Note   Because of [name]'s co-morbid illnesses, she is at least at moderate risk for complications without adequate follow up.  This format is felt to be most appropriate for this patient at this time.  All issues noted in this document were discussed and addressed.  A limited physical exam was performed with this format.  Please refer to the patient's chart for her consent to telehealth for [facility].      Date:  12/12/2022   ID:  Elizabeth Castro, DOB 11-17-1945, MRN [id number]  Patient Location: Home Provider Location: Office/Clinic  PCP:  Georgann Housekeeper, MD  Cardiologist:  Thomasene Ripple, DO  Electrophysiologist:  None   Evaluation Performed:  Follow-Up Visit  Virtual Visit via Video  Note . I connected with the patient today by a   video enabled telemedicine application and verified that I am speaking with the correct person using two identifiers.  Chief Complaint:  " I am doing well"  History of Present Illness:    Elizabeth Castro is a 77 y.o. female with hyperlipidemia, hypertension here today for follow-up visit.  I saw the patient on July 16 24 at that time she reported that she had been experiencing shortness of breath as well as intermittent lightheadedness.  During that time I placed a monitor patient again echocardiogram.  Her echocardiogram was normal.   Since her last visit with me she wore the zio and is here today to discuss the result.  The patient does not have symptoms concerning for COVID-19 infection (fever, chills, cough, or new shortness of breath).    Past Medical History:  Diagnosis Date   Family history of ovarian cancer    High cholesterol    Hypertension    Past Surgical History:  Procedure Laterality Date   BREAST SURGERY     LEFT BREAST BIOPSY   COLONOSCOPY W/ POLYPECTOMY  2002   BENIGN   ENDOMETRIAL ABLATION  1997   RESECTOSCOPIC POLYPECTOMY   TUBAL LIGATION  1977     Current Meds   Medication Sig   acetaminophen (TYLENOL) 500 MG tablet Take 2 tablets (1,000 mg total) by mouth every 8 (eight) hours as needed.   cholecalciferol (VITAMIN D) 1000 UNITS tablet Take 1,000 Units by mouth daily.   donepezil (ARICEPT) 5 MG tablet Take 5 mg by mouth at bedtime.   furosemide (LASIX) 40 MG tablet Take 40 mg by mouth every morning. 1/2 tab   hydrochlorothiazide (HYDRODIURIL) 25 MG tablet Take 25 mg by mouth daily.     metoprolol succinate (TOPROL-XL) 25 MG 24 hr tablet Take 0.5 tablets (12.5 mg total) by mouth daily.   potassium chloride (KLOR-CON) 10 MEQ tablet Take 20 mEq by mouth daily.   simvastatin (ZOCOR) 40 MG tablet Take 40 mg by mouth at bedtime.       Allergies:   No known allergies   Social History   Tobacco Use   Smoking status: Never   Smokeless tobacco: Never  Vaping Use   Vaping status: Never Used  Substance Use Topics   Alcohol use: Yes    Alcohol/week: 0.0 standard drinks of alcohol    Comment: wine    Drug use: No     Family Hx: The patient's family history includes Ovarian cancer (age of onset: 59) in her mother.  ROS:   Please see the history of present illness.    lightheadedness All other systems reviewed and are negative.   Prior CV studies:  The following studies were reviewed today:  Zio monitor    Normal sinus rhythm   Occasional PACs   SVT - occasional runs with longest episode 1 minute and 32 seconds.   Rare PVCs     Patch Wear Time:  12 days and 1 hours (2024-07-18T18:38:10-398 to 2024-07-30T19:45:44-0400)   Patient had a min HR of 45 bpm, max HR of 164 bpm, and avg HR of 71 bpm. Predominant underlying rhythm was Sinus Rhythm. 2907 Supraventricular Tachycardia runs occurred, the run with the fastest interval lasting 15.0 secs with a max rate of 164 bpm, the  longest lasting 1 min 32 secs with an avg rate of 121 bpm. Some episodes of Supraventricular Tachycardia may be possible Atrial Tachycardia with variable block. True  duration of Supraventricular Tachycardia difficult to ascertain due to artifact.  Isolated SVEs were occasional (4.7%, [id number]), SVE Couplets were rare (<1.0%, 288), and SVE Triplets were rare (<1.0%, 588). Isolated VEs were rare (<1.0%, 7277), VE Couplets were rare (<1.0%, 2), and VE Triplets were rare (<1.0%, 1). Ventricular Trigeminy  was present. Inverted QRS complexes possibly due to inverted placement of device.  Labs/Other Tests and Data Reviewed:    EKG:  No ECG reviewed.  Recent Labs: 09/16/2022: B Natriuretic Peptide 150.5; BUN 12; Creatinine, Ser 1.00; Hemoglobin 10.7; Platelets 159; Potassium 3.2; Sodium 143   Recent Lipid Panel No results found for: "CHOL", "TRIG", "HDL", "CHOLHDL", "LDLCALC", "LDLDIRECT"  Wt Readings from Last 3 Encounters:  12/12/22 155 lb (70.3 kg)  10/11/22 152 lb 6.4 oz (69.1 kg)  09/16/22 200 lb (90.7 kg)     Objective:    Vital Signs:  Ht 5\' 5"  (1.651 m)   Wt 155 lb (70.3 kg)   BMI 25.79 kg/m      ASSESSMENT & PLAN:    Paroxysmal supraventricular tachycardia Occasional PAC Hypertension  I discussed with the patient that she has significant burden of paroxysmal supraventricular tachycardia which is likely atrial tachycardia (while she wore the monitor she had 2907 episodes). She is agreeable to get treated.  Will start her on Toprol XL 12.5 mg daily. Continue current medication for her blood pressure. COVID-19 Education: The signs and symptoms of COVID-19 were discussed with the patient and how to seek care for testing (follow up with PCP or arrange E-visit).  The importance of social distancing was discussed today.  Time:   Today, I have spent 15 minutes with the patient with telehealth technology discussing the above problems.     Medication Adjustments/Labs and Tests Ordered: Current medicines are reviewed at length with the patient today.  Concerns regarding medicines are outlined above.   Tests Ordered: No orders of the defined  types were placed in this encounter.   Medication Changes: Meds ordered this encounter  Medications   metoprolol succinate (TOPROL-XL) 25 MG 24 hr tablet    Sig: Take 0.5 tablets (12.5 mg total) by mouth daily.    Dispense:  45 tablet    Refill:  3    Follow Up:  In Person in 4 month(s)  Signed, Thomasene Ripple, DO  12/12/2022 9:50 AM    [facility]

## 2023-01-19 ENCOUNTER — Ambulatory Visit: Payer: Medicare HMO | Admitting: Cardiology

## 2023-01-27 ENCOUNTER — Other Ambulatory Visit: Payer: Self-pay | Admitting: Internal Medicine

## 2023-01-27 DIAGNOSIS — Z1231 Encounter for screening mammogram for malignant neoplasm of breast: Secondary | ICD-10-CM

## 2023-02-09 ENCOUNTER — Ambulatory Visit: Payer: Medicare HMO | Attending: Cardiology | Admitting: Cardiology

## 2023-02-10 ENCOUNTER — Ambulatory Visit: Payer: Medicare HMO | Admitting: Cardiology

## 2023-03-17 ENCOUNTER — Ambulatory Visit: Payer: Medicare HMO

## 2023-04-10 DIAGNOSIS — I10 Essential (primary) hypertension: Secondary | ICD-10-CM | POA: Diagnosis not present

## 2023-04-10 DIAGNOSIS — I509 Heart failure, unspecified: Secondary | ICD-10-CM | POA: Diagnosis not present

## 2023-04-10 DIAGNOSIS — F0390 Unspecified dementia without behavioral disturbance: Secondary | ICD-10-CM | POA: Diagnosis not present

## 2023-04-10 DIAGNOSIS — N1831 Chronic kidney disease, stage 3a: Secondary | ICD-10-CM | POA: Diagnosis not present

## 2023-07-26 DIAGNOSIS — N1831 Chronic kidney disease, stage 3a: Secondary | ICD-10-CM | POA: Diagnosis not present

## 2023-07-26 DIAGNOSIS — I10 Essential (primary) hypertension: Secondary | ICD-10-CM | POA: Diagnosis not present

## 2023-07-26 DIAGNOSIS — E7800 Pure hypercholesterolemia, unspecified: Secondary | ICD-10-CM | POA: Diagnosis not present

## 2023-07-26 DIAGNOSIS — I509 Heart failure, unspecified: Secondary | ICD-10-CM | POA: Diagnosis not present

## 2023-07-26 DIAGNOSIS — F0390 Unspecified dementia without behavioral disturbance: Secondary | ICD-10-CM | POA: Diagnosis not present

## 2023-08-22 DIAGNOSIS — Z961 Presence of intraocular lens: Secondary | ICD-10-CM | POA: Diagnosis not present

## 2023-08-22 DIAGNOSIS — H43813 Vitreous degeneration, bilateral: Secondary | ICD-10-CM | POA: Diagnosis not present

## 2023-08-22 DIAGNOSIS — H26492 Other secondary cataract, left eye: Secondary | ICD-10-CM | POA: Diagnosis not present

## 2023-08-22 DIAGNOSIS — H524 Presbyopia: Secondary | ICD-10-CM | POA: Diagnosis not present

## 2023-09-25 DIAGNOSIS — I509 Heart failure, unspecified: Secondary | ICD-10-CM | POA: Diagnosis not present

## 2023-09-25 DIAGNOSIS — F0390 Unspecified dementia without behavioral disturbance: Secondary | ICD-10-CM | POA: Diagnosis not present

## 2023-09-25 DIAGNOSIS — E7800 Pure hypercholesterolemia, unspecified: Secondary | ICD-10-CM | POA: Diagnosis not present

## 2023-09-25 DIAGNOSIS — N1831 Chronic kidney disease, stage 3a: Secondary | ICD-10-CM | POA: Diagnosis not present

## 2023-10-02 DIAGNOSIS — I10 Essential (primary) hypertension: Secondary | ICD-10-CM | POA: Diagnosis not present

## 2023-10-02 DIAGNOSIS — I509 Heart failure, unspecified: Secondary | ICD-10-CM | POA: Diagnosis not present

## 2023-10-02 DIAGNOSIS — N1831 Chronic kidney disease, stage 3a: Secondary | ICD-10-CM | POA: Diagnosis not present

## 2023-10-02 DIAGNOSIS — E7800 Pure hypercholesterolemia, unspecified: Secondary | ICD-10-CM | POA: Diagnosis not present

## 2023-10-02 DIAGNOSIS — M179 Osteoarthritis of knee, unspecified: Secondary | ICD-10-CM | POA: Diagnosis not present

## 2023-10-23 ENCOUNTER — Ambulatory Visit (INDEPENDENT_AMBULATORY_CARE_PROVIDER_SITE_OTHER): Admitting: Obstetrics and Gynecology

## 2023-10-23 ENCOUNTER — Encounter: Payer: Self-pay | Admitting: Obstetrics and Gynecology

## 2023-10-23 VITALS — BP 122/76 | HR 70

## 2023-10-23 DIAGNOSIS — E2839 Other primary ovarian failure: Secondary | ICD-10-CM

## 2023-10-23 DIAGNOSIS — N950 Postmenopausal bleeding: Secondary | ICD-10-CM

## 2023-10-23 DIAGNOSIS — Z1231 Encounter for screening mammogram for malignant neoplasm of breast: Secondary | ICD-10-CM

## 2023-10-23 NOTE — Patient Instructions (Signed)
 Last mammogram was in 2021.  I have placed a referral for one at [facility].  This should be done yearly Along with the bone density.  Your last bone density showed osteopenia in 2016.  This needs repeat every 2 years.  You will likely need bone support medication.  I will know when the repeat scan is done.  I will see you back for a vaginal ultrasound, endometrial biopsy and annual exam. It was very nice meeting you and your lovely family.  Dr. Glennon

## 2023-10-23 NOTE — Progress Notes (Signed)
 78 y.o. y.o. female hereto reestablish care No LMP recorded. Patient is postmenopausal.   Has dementia. Daughter comes with her MMG 2021 Dxa 2016 osteopenia -1.2, referral placed Last pap smear 2020 Denies any abnormal pap smears Last colonoscopy: Dr. Kristie recently, per patient. To get records Mother with ovarian cancer no other gyn cancerns PCP [facility]ain at [facility]  Daughter has picture of bedding with blood on it that she noticed on June 23rd. Denies any pelvic pain or abnormal discharge or any other complaints.  There is no height or weight on file to calculate BMI.  Date: 11/21/2016 Department: [facility] Released By: Jerilynn Sari SAUNDERS Authorizing: Lavoie, Marie-Lyne, MD   Exam Status  Status  Final [99]   PACS Intelerad Image Link   Show images for US  Transvaginal Non-OB Study Result  Narrative & Impression  T/V Anteverted Uterus 6.2 x 5.4 x 3.2 cm, no change in intramural fibroids compared with US  06/2016.  Right Ovary normal.  Left Ovary with heterogeneous echogenic focal area measuring 0.9 x 0.9 cm.  Negative CFD.  Decreased in size since US  06/2016 when it was measured at 1.5 cm.  No FF in CDS.        No data to display          Blood pressure 122/76, pulse 70, SpO2 99%.  No results found for: DIAGPAP, HPVHIGH, ADEQPAP  GYN HISTORY: No results found for: DIAGPAP, HPVHIGH, ADEQPAP  OB History  Gravida Para Term Preterm AB Living  2 2    2   SAB IAB Ectopic Multiple Live Births      2    # Outcome Date GA Lbr Len/2nd Weight Sex Type Anes PTL Lv  2 Para     F Vag-Spont     1 Para     F Vag-Spont       Past Medical History:  Diagnosis Date   Dementia (HCC)    Family history of ovarian cancer    High cholesterol    Hypertension     Past Surgical History:  Procedure Laterality Date   BREAST SURGERY     LEFT BREAST BIOPSY   COLONOSCOPY W/ POLYPECTOMY  2002   BENIGN   ENDOMETRIAL ABLATION   1997   RESECTOSCOPIC POLYPECTOMY   TUBAL LIGATION  1977    Current Outpatient Medications on File Prior to Visit  Medication Sig Dispense Refill   acetaminophen  (TYLENOL ) 500 MG tablet Take 2 tablets (1,000 mg total) by mouth every 8 (eight) hours as needed. 30 tablet 0   donepezil (ARICEPT) 5 MG tablet Take 5 mg by mouth at bedtime.     furosemide (LASIX) 40 MG tablet Take 40 mg by mouth every morning. 1/2 tab     metoprolol  succinate (TOPROL -XL) 25 MG 24 hr tablet Take 0.5 tablets (12.5 mg total) by mouth daily. 45 tablet 3   potassium chloride  (KLOR-CON ) 10 MEQ tablet Take 20 mEq by mouth daily.     simvastatin (ZOCOR) 40 MG tablet Take 40 mg by mouth at bedtime.       triamterene-hydrochlorothiazide (MAXZIDE-25) 37.5-25 MG tablet Take 0.5 tablets by mouth daily.     No current facility-administered medications on file prior to visit.    Social History   Socioeconomic History   Marital status: Married    Spouse name: Not on file   Number of children: Not on file   Years of education: Not on file   Highest education level:  Not on file  Occupational History   Not on file  Tobacco Use   Smoking status: Never   Smokeless tobacco: Never  Vaping Use   Vaping status: Never Used  Substance and Sexual Activity   Alcohol use: Yes    Alcohol/week: 0.0 standard drinks of alcohol    Comment: wine    Drug use: No   Sexual activity: Not Currently    Partners: Male    Birth control/protection: Post-menopausal    Comment: 1st intercorse- 66, married- 54 yrs   Other Topics Concern   Not on file  Social History Narrative   Not on file   Social Drivers of Health   Financial Resource Strain: Not on file  Food Insecurity: Not on file  Transportation Needs: Not on file  Physical Activity: Not on file  Stress: Not on file  Social Connections: Not on file  Intimate Partner Violence: Not on file    Family History  Problem Relation Age of Onset   Ovarian cancer Mother 12      Allergies  Allergen Reactions   No Known Allergies       Patient's last menstrual period was No LMP recorded. Patient is postmenopausal..           Review of Systems Alls systems reviewed and are negative.     OBGyn Exam    A:         Restablish care for PMB osteopenia                             P:        Pap smear to collect at next visit with cultures and EMB Encouraged annual mammogram screening Colon cancer screening to get records-record release signed DXA ordered today Labs and immunizations to do with PMD Counseled on importance for PUS, EMB and work-up to rule out endometrial cancer as the cause of the PMB.  Both agreed and procedure was discussed with the patient.  She will return for these  30 minutes spent on reviewing records, imaging,  and one on one patient time and counseling patient and documentation Dr. Glennon   No follow-ups on file.  Elizabeth Castro

## 2023-10-25 ENCOUNTER — Other Ambulatory Visit (HOSPITAL_COMMUNITY)
Admission: RE | Admit: 2023-10-25 | Discharge: 2023-10-25 | Disposition: A | Discharge status: Home / Self Care | Source: Ambulatory Visit | Attending: Obstetrics and Gynecology | Admitting: Obstetrics and Gynecology

## 2023-10-25 ENCOUNTER — Ambulatory Visit (INDEPENDENT_AMBULATORY_CARE_PROVIDER_SITE_OTHER): Admitting: Obstetrics and Gynecology

## 2023-10-25 ENCOUNTER — Other Ambulatory Visit: Payer: Self-pay | Admitting: Obstetrics and Gynecology

## 2023-10-25 ENCOUNTER — Encounter: Payer: Self-pay | Admitting: Obstetrics and Gynecology

## 2023-10-25 ENCOUNTER — Other Ambulatory Visit (INDEPENDENT_AMBULATORY_CARE_PROVIDER_SITE_OTHER)

## 2023-10-25 DIAGNOSIS — N950 Postmenopausal bleeding: Secondary | ICD-10-CM

## 2023-10-25 DIAGNOSIS — Z124 Encounter for screening for malignant neoplasm of cervix: Secondary | ICD-10-CM | POA: Diagnosis not present

## 2023-10-25 MED ORDER — MISOPROSTOL 200 MCG PO TABS: 400.0000 ug | ORAL_TABLET | Freq: Four times a day (QID) | ORAL | 0 refills | Status: DC

## 2023-10-25 NOTE — Telephone Encounter (Signed)
 Med clarification.  RX written for cytotec  200 mcg QTY 1 With directions:  ig: PLACE 2 TABLETS VAGINALLY 4 TIMES DAILY. PLACE IN THE VAGINA THE NIGHT BEFORE THE PROCEDURE   Changed QTY to #2 Directions:  place 2 tablets vaginally night before the procedure Sent to provider for review.

## 2023-10-25 NOTE — Progress Notes (Signed)
 78 y.o. y.o. female with PMB for EMB, pap smear and cultures Here today with daughter No LMP recorded. Patient is postmenopausal.   Has dementia. Daughter comes with her MMG 2021 Dxa 2016 osteopenia -1.2, referral placed Last pap smear 2020 Denies any abnormal pap smears Last colonoscopy: Dr. Kristie recently, per patient. To get records Mother with ovarian cancer no other gyn cancerns PCP Dr[facility] Husain at [facility]  Daughter has picture of bedding with blood on it that she noticed on June 23rd. Denies any pelvic pain or abnormal discharge or any other complaints.  Body mass index is 28.82 kg/m.  Date: 11/21/2016 Department: [facility] Released By: Jerilynn Sari SAUNDERS Authorizing: Lavoie, Marie-Lyne, MD   Exam Status  Status  Final [99]   PACS Intelerad Image Link   Show images for US  Transvaginal Non-OB Study Result  Narrative & Impression  T/V Anteverted Uterus 6.2 x 5.4 x 3.2 cm, no change in intramural fibroids compared with US  06/2016.  Right Ovary normal.  Left Ovary with heterogeneous echogenic focal area measuring 0.9 x 0.9 cm.  Negative CFD.  Decreased in size since US  06/2016 when it was measured at 1.5 cm.  No FF in CDS.    PUS 10/25/23 3.95cm uterus Multiple small fibroids seen stable compared to 2018 LO stable echogenic area RO wnl      No data to display          Blood pressure 130/60, pulse (!) 56, weight 173 lb 3.2 oz (78.6 kg), SpO2 99%.  No results found for: DIAGPAP, HPVHIGH, ADEQPAP  GYN HISTORY: No results found for: DIAGPAP, HPVHIGH, ADEQPAP  OB History  Gravida Para Term Preterm AB Living  2 2    2   SAB IAB Ectopic Multiple Live Births      2    # Outcome Date GA Lbr Len/2nd Weight Sex Type Anes PTL Lv  2 Para     F Vag-Spont     1 Para     F Vag-Spont       Past Medical History:  Diagnosis Date   Dementia (HCC)    Family history of ovarian cancer    High cholesterol     Hypertension    Memory change     Past Surgical History:  Procedure Laterality Date   BREAST SURGERY     LEFT BREAST BIOPSY   COLONOSCOPY W/ POLYPECTOMY  2002   BENIGN   ENDOMETRIAL ABLATION  1997   RESECTOSCOPIC POLYPECTOMY   TUBAL LIGATION  1977    Current Outpatient Medications on File Prior to Visit  Medication Sig Dispense Refill   donepezil (ARICEPT) 5 MG tablet Take 5 mg by mouth at bedtime.     furosemide (LASIX) 40 MG tablet Take 40 mg by mouth every morning. 1/2 tab     metoprolol  succinate (TOPROL -XL) 25 MG 24 hr tablet Take 0.5 tablets (12.5 mg total) by mouth daily. 45 tablet 3   potassium chloride  (KLOR-CON ) 10 MEQ tablet Take 20 mEq by mouth daily.     simvastatin (ZOCOR) 40 MG tablet Take 40 mg by mouth at bedtime.       triamterene-hydrochlorothiazide (MAXZIDE-25) 37.5-25 MG tablet Take 0.5 tablets by mouth daily.     acetaminophen  (TYLENOL ) 500 MG tablet Take 2 tablets (1,000 mg total) by mouth every 8 (eight) hours as needed. (Patient not taking: Reported on 10/25/2023) 30 tablet 0   No current facility-administered medications on file prior to visit.  Social History   Socioeconomic History   Marital status: Married    Spouse name: Not on file   Number of children: Not on file   Years of education: Not on file   Highest education level: Not on file  Occupational History   Not on file  Tobacco Use   Smoking status: Never   Smokeless tobacco: Never  Vaping Use   Vaping status: Never Used  Substance and Sexual Activity   Alcohol use: Not Currently    Comment: wine    Drug use: No   Sexual activity: Not Currently    Partners: Male    Birth control/protection: Post-menopausal    Comment: 1st intercorse- 16, married- 54 yrs   Other Topics Concern   Not on file  Social History Narrative   Not on file   Social Drivers of Health   Financial Resource Strain: Not on file  Food Insecurity: Not on file  Transportation Needs: Not on file  Physical  Activity: Not on file  Stress: Not on file  Social Connections: Not on file  Intimate Partner Violence: Not on file    Family History  Problem Relation Age of Onset   Ovarian cancer Mother 14     Allergies  Allergen Reactions   No Known Allergies       Patient's last menstrual period was No LMP recorded. Patient is postmenopausal..           Review of Systems Alls systems reviewed and are negative.     Physical Exam Constitutional:      Appearance: Normal appearance.  Genitourinary:     Right Labia: No rash or lesions.    Left Labia: No lesions or rash.    No vaginal discharge or ulceration.     Anterior vaginal prolapse present.    Moderate vaginal atrophy present. Musculoskeletal:     Cervical back: Normal range of motion.  Neurological:     Mental Status: She is alert and oriented to person, place, and time.  Psychiatric:        Mood and Affect: Mood normal.        Behavior: Behavior normal.  Vitals and nursing note reviewed.    PUS today to evaluate uterus and ovaries Narrative & Impression  3.95cm uterus Multiple small fibroids seen stable compared to 2018 LO stable echogenic area RO wnl     PROCEDURE: EMB Time out performed Consent obtained for the procedure.  A bivalve speculum was placed in the vagina.  The cervix was grasped with a single tooth tenaculum.  Cervix stenosed. Tried small metal lacrimal dilators and patient was uncomfortable to proceed to dilate to pipelle size.  Procedure stopped  All instruments were removed.    A:                      PMBx1 episode noted large amount on sheet by daughter and picture taken                P:        Pap smear and cultures collected today To return for possible leep and EMB Patient to place cytotec  the night before the procedure Explained to daughter.  Patient may take tylenol  or motrin before the procedure as well    No follow-ups on file.  Elizabeth Castro

## 2023-10-26 LAB — SURESWAB® ADVANCED VAGINITIS PLUS,TMA
C. trachomatis RNA, TMA: NOT DETECTED
CANDIDA SPECIES: NOT DETECTED
Candida glabrata: NOT DETECTED
N. gonorrhoeae RNA, TMA: NOT DETECTED
SURESWAB(R) ADV BACTERIAL VAGINOSIS(BV),TMA: NEGATIVE
TRICHOMONAS VAGINALIS (TV),TMA: NOT DETECTED

## 2023-10-27 ENCOUNTER — Ambulatory Visit: Payer: Self-pay | Admitting: Obstetrics and Gynecology

## 2023-10-30 LAB — CYTOLOGY - PAP
Adequacy: ABSENT
Diagnosis: NEGATIVE

## 2023-10-31 ENCOUNTER — Telehealth: Payer: Self-pay | Admitting: *Deleted

## 2023-10-31 NOTE — Telephone Encounter (Signed)
 Spoke with patients daughter, Danise, ok per dpr. Advised no LEEP needed. Return for EMB and B&P exam as scheduled for 11/17/23. Cytotec  and motrin or tylenol  reviewed. Questions answered.   Routing to provider for final review. Patient is agreeable to disposition. Will close encounter.

## 2023-10-31 NOTE — Telephone Encounter (Signed)
-----   Message from Bradford S sent at 10/31/2023  9:02 AM EDT ----- Regarding: RE: leep ? Tiki/ the cell # on chart is Tiki's ----- Message ----- From: Brutus Kate SAILOR, RN Sent: 10/31/2023   8:27 AM EDT To: Will Barnacle Subject: RE: leep ?                                     Walterine Will -I did not get anything on Friday for her. Do you know which daughter I am calling back?   [facility] ----- Message ----- From: Barnacle Will Sent: 10/30/2023   8:22 AM EDT To: [facility] Triage Subject: leep ?                                         Don't know if I sent this on Friday patent's daughter wants a call for someone to explain leep procedure. Also patient has B& P and endo bx appointment on August 22 should we change that to leep and endo bx as Glennon stated on her notes?

## 2023-10-31 NOTE — Telephone Encounter (Signed)
 Reviewed with Dr. Glennon.  Glennon Almarie POUR, MD  Dallie Vera GAILS, MD; Brutus Kate SAILOR, RN No leep.  Pap smear was normal.  Just EMB Dr. Glennon   Call placed to patients daughter, Danise, ok per dpr. Left message to call [facility] Triage at [phone], option 4.

## 2023-11-09 ENCOUNTER — Telehealth: Payer: Self-pay | Admitting: Obstetrics and Gynecology

## 2023-11-09 ENCOUNTER — Other Ambulatory Visit: Payer: Self-pay | Admitting: Obstetrics and Gynecology

## 2023-11-09 MED ORDER — MISOPROSTOL 200 MCG PO TABS
400.0000 ug | ORAL_TABLET | Freq: Once | ORAL | 0 refills | Status: DC
Start: 2023-11-09 — End: 2023-11-09

## 2023-11-09 MED ORDER — MISOPROSTOL 200 MCG PO TABS: 400.0000 ug | ORAL_TABLET | Freq: Once | ORAL | 0 refills | Status: AC

## 2023-11-09 MED ORDER — MISOPROSTOL 200 MCG PO TABS: 400.0000 ug | ORAL_TABLET | Freq: Once | ORAL | 0 refills | Status: DC

## 2023-11-09 MED ORDER — MISOPROSTOL 200 MCG PO TABS
200.0000 ug | ORAL_TABLET | Freq: Four times a day (QID) | ORAL | 0 refills | Status: DC
Start: 2023-11-09 — End: 2023-11-09

## 2023-11-09 NOTE — Telephone Encounter (Signed)
 Spoke with Zebedee, daughter of Melisse, Leep to open cervix may not be covered with her insurance and may be an additional charge. She is willing to cover the cost, if not covered. Pathology on the specimen if sent from leep may not be covered as well, but is not necessary to send. Discussed option with just trying cytotec , but may not work and may need multiple attempts, which the EMB attempt is not comfortable for her. Dr. Glennon  She also mentioned [facility]  [facility], and explained it went through on 7/30 and will send there and to [facility].  Explained dosing and instructions. 400 micrograms once in the vagina the night before the procedure.  She voiced understanding. Dr. Glennon  TO send to [facility] with any complications to get the cytotec 

## 2023-11-09 NOTE — Telephone Encounter (Signed)
 Please update directions and resend   Script clarification   You wrote the misoprostol  for 1 tab but the directions state to take 4 times a day.  Please correct.   Artisha Capri

## 2023-11-09 NOTE — Telephone Encounter (Signed)
 Patient has 3 prescriptions on file for cytotec .   Spoke with patients daughter, Zebedee. Ok per dpr. Reviewed plan of care.   Appt scheduled for 8/22 updated to LEEP and EMB.   B&P exam scheduled for 12/12/23 at 1400.   [facility]. Advised I will call to cancel duplicate prescriptions and confirm Rx received.   Reviewed instructions for Cytotec , daughter expressed concerns about her mother being able to place these tablets, since she has dementia. Reviewed with Zebedee placement of the Cytotec  and common side effects. Zebedee states she is uncertain if they can place them, will try. Advised I will review with Dr. Glennon and return call with alternative if unable to place.   Spoke [place] at [facility], Rx for cytotec  cancelled.   Spoke with Karleen at [facility]. Cytotec  RX cancelled for 1 tab. Advised to fill RX Cytotec  200 mcg (400 mcg) vaginally night prior to procedure.   Dr. Glennon -if patient or daughter unable to place cytotec , do you plan to proceed to attempt EMB as scheduled?   Cc: Miranda for benefits

## 2023-11-09 NOTE — Telephone Encounter (Signed)
 Pharmacy is requesting a change in medication. Please see message below.   Misoprostol 200 MCG tablet  Changes Requested In Note From Pharmacy    To prescribe a different medication, open the encounter and click Replace. To modify prescription details, edit the order in the composer. To direct the pharmacy to dispense the original prescription, refuse this request.   Pharmacy comment: Alternative Requested:QTY?  The following fields have changes from the original prescription: Sig

## 2023-11-09 NOTE — Addendum Note (Signed)
 Addended by: BRUTUS KATE SAILOR on: 11/09/2023 11:16 AM   Modules accepted: Orders

## 2023-11-09 NOTE — Addendum Note (Signed)
 Addended by: Kaan Tosh N on: 11/09/2023 01:40 PM   Modules accepted: Orders

## 2023-11-09 NOTE — Telephone Encounter (Signed)
 Call placed to patients daughter, Zebedee. Left detailed message. Advised per Dr. Glennon. [facility] . Instructions provided. Return call to [phone] opt [phone] if any additional questions.   Encounter closed.

## 2023-11-09 NOTE — Telephone Encounter (Signed)
 Spoke with Zebedee daughter of Zoa. Discussed may be additional cost

## 2023-11-17 ENCOUNTER — Other Ambulatory Visit (HOSPITAL_COMMUNITY)
Admission: RE | Admit: 2023-11-17 | Discharge: 2023-11-17 | Disposition: A | Discharge status: Home / Self Care | Source: Ambulatory Visit | Attending: Obstetrics and Gynecology | Admitting: Obstetrics and Gynecology

## 2023-11-17 ENCOUNTER — Ambulatory Visit (INDEPENDENT_AMBULATORY_CARE_PROVIDER_SITE_OTHER): Admitting: Obstetrics and Gynecology

## 2023-11-17 VITALS — BP 132/64 | HR 64 | Wt 169.0 lb

## 2023-11-17 DIAGNOSIS — N950 Postmenopausal bleeding: Secondary | ICD-10-CM | POA: Insufficient documentation

## 2023-11-17 DIAGNOSIS — N858 Other specified noninflammatory disorders of uterus: Secondary | ICD-10-CM | POA: Diagnosis not present

## 2023-11-17 NOTE — Progress Notes (Addendum)
   Acute Office Visit  Subjective:    Patient ID: Elizabeth Castro, female    DOB: 1946-02-15, 78 y.o.   MRN: [id number]   HPI 78 y.o. presents today for Gynecologic Exam (Last pap 10/25/23/Mammo. 06/03/20/LEEP and EMB ) . Patient with cervical stenosis.  Cytotec  was placed last night. No recurrent VB Here today for second attempt at EMB possible leep if needed to open cervix. No LMP recorded. Patient is postmenopausal.    Review of Systems     Objective:    OBGyn Exam  BP 132/64   Pulse 64   Wt 169 lb (76.7 kg)   SpO2 98%   BMI 28.12 kg/m  Wt Readings from Last 3 Encounters:  11/17/23 169 lb (76.7 kg)  10/25/23 173 lb 3.2 oz (78.6 kg)  12/12/22 155 lb (70.3 kg)    PROCEDURE: EMB Time out performed Consent obtained for the procedure.  A bivalve speculum was placed in the vagina.  The cervix was grasped with a single tooth tenaculum.  Pipelle was inserted and rotated.  Uterus sound to 7 cm.  Patient could not tolerate the procedure and there was scant cells obtained and was sent to pathology.  All instruments were removed.  Patient tolerated the procedure well.  To notify patient of the results.      Patient informed chaperone available to be present for breast and/or pelvic exam. Patient has requested no chaperone to be present. Patient has been advised what will be completed during breast and pelvic exam.   Assessment & Plan:  PMB Was able to insert past the cervix with the cytotec . Pipelle was at fundus and rotated once for cells and patient could not tolerate and all equipment removed.  If insufficient cells, low likelihood of cancer with only scant cells on exam today. Can continue to follow lining with PUS. Or may need D&C in OR with persistent bleeding.  Elizabeth Castro

## 2023-11-17 NOTE — Addendum Note (Signed)
 Addended by: GLENNON ALMARIE POUR on: 11/17/2023 10:14 AM   Modules accepted: Orders

## 2023-11-17 NOTE — Patient Instructions (Addendum)
 If she continues to bleed or discharge seen on sheets, we can do hysteroscopy in the operating room, where she is asleep and comfortable as an option.  Wanted to mentions this as well.  If everything is fine, let's see her yearly.  Dr. Glennon

## 2023-11-20 LAB — SURGICAL PATHOLOGY

## 2023-11-22 ENCOUNTER — Ambulatory Visit: Payer: Self-pay | Admitting: Obstetrics and Gynecology

## 2023-11-22 DIAGNOSIS — N950 Postmenopausal bleeding: Secondary | ICD-10-CM

## 2023-11-26 DIAGNOSIS — F0390 Unspecified dementia without behavioral disturbance: Secondary | ICD-10-CM | POA: Diagnosis not present

## 2023-11-26 DIAGNOSIS — E7800 Pure hypercholesterolemia, unspecified: Secondary | ICD-10-CM | POA: Diagnosis not present

## 2023-11-26 DIAGNOSIS — N1831 Chronic kidney disease, stage 3a: Secondary | ICD-10-CM | POA: Diagnosis not present

## 2023-11-26 DIAGNOSIS — I509 Heart failure, unspecified: Secondary | ICD-10-CM | POA: Diagnosis not present

## 2023-12-12 ENCOUNTER — Encounter: Admitting: Obstetrics and Gynecology

## 2023-12-26 DIAGNOSIS — F0390 Unspecified dementia without behavioral disturbance: Secondary | ICD-10-CM | POA: Diagnosis not present

## 2023-12-26 DIAGNOSIS — E7800 Pure hypercholesterolemia, unspecified: Secondary | ICD-10-CM | POA: Diagnosis not present

## 2023-12-26 DIAGNOSIS — N1831 Chronic kidney disease, stage 3a: Secondary | ICD-10-CM | POA: Diagnosis not present

## 2023-12-26 DIAGNOSIS — I509 Heart failure, unspecified: Secondary | ICD-10-CM | POA: Diagnosis not present

## 2024-01-12 ENCOUNTER — Encounter: Admitting: Obstetrics and Gynecology

## 2024-01-26 DIAGNOSIS — I509 Heart failure, unspecified: Secondary | ICD-10-CM | POA: Diagnosis not present

## 2024-01-26 DIAGNOSIS — F0390 Unspecified dementia without behavioral disturbance: Secondary | ICD-10-CM | POA: Diagnosis not present

## 2024-01-26 DIAGNOSIS — E7800 Pure hypercholesterolemia, unspecified: Secondary | ICD-10-CM | POA: Diagnosis not present

## 2024-01-26 DIAGNOSIS — N1831 Chronic kidney disease, stage 3a: Secondary | ICD-10-CM | POA: Diagnosis not present

## 2024-02-13 ENCOUNTER — Encounter: Admitting: Obstetrics and Gynecology

## 2024-02-14 ENCOUNTER — Ambulatory Visit: Payer: Self-pay | Admitting: Obstetrics and Gynecology

## 2024-02-14 ENCOUNTER — Encounter: Payer: Self-pay | Admitting: Obstetrics and Gynecology

## 2024-02-14 ENCOUNTER — Ambulatory Visit: Admitting: Obstetrics and Gynecology

## 2024-02-14 VITALS — BP 124/60 | HR 60 | Ht 63.75 in | Wt 178.0 lb

## 2024-02-14 DIAGNOSIS — Z1211 Encounter for screening for malignant neoplasm of colon: Secondary | ICD-10-CM

## 2024-02-14 DIAGNOSIS — Z01419 Encounter for gynecological examination (general) (routine) without abnormal findings: Secondary | ICD-10-CM | POA: Diagnosis not present

## 2024-02-14 DIAGNOSIS — R350 Frequency of micturition: Secondary | ICD-10-CM

## 2024-02-14 LAB — NO CULTURE INDICATED

## 2024-02-14 LAB — URINALYSIS, COMPLETE W/RFL CULTURE
Bacteria, UA: NONE SEEN /HPF
Bilirubin Urine: NEGATIVE
Glucose, UA: NEGATIVE
Hgb urine dipstick: NEGATIVE
Hyaline Cast: NONE SEEN /LPF
Ketones, ur: NEGATIVE
Leukocyte Esterase: NEGATIVE
Nitrites, Initial: NEGATIVE
Protein, ur: NEGATIVE
RBC / HPF: NONE SEEN /HPF (ref 0–2)
Specific Gravity, Urine: 1.015 (ref 1.001–1.035)
WBC, UA: NONE SEEN /HPF (ref 0–5)
pH: 5.5 (ref 5.0–8.0)

## 2024-02-14 NOTE — Progress Notes (Signed)
 78 y.o. y.o. female here for annual medicare exam. No LMP recorded. Patient is postmenopausal.    H/o PMB x1 episode scant cells on EMB biopsy and was painful for patient. To follow with PUS Mother with ovarian cancer Patient has dementia, typically brings daughter with her to appts and daughter answers MOST questions pertaining to her mother.   Medicare--GCG  Low Risk  Breast and Pelvic Exam P 10/25/23 normal M 06/03/20 C 06/05/17 D 03/19/15 Body mass index is 30.79 kg/m.   Blood pressure 124/60, pulse 60, height 5' 3.75 (1.619 m), weight 178 lb (80.7 kg), SpO2 99%.     Component Value Date/Time   DIAGPAP  10/25/2023 1108    - Negative for intraepithelial lesion or malignancy (NILM)   ADEQPAP  10/25/2023 1108    Satisfactory for evaluation; transformation zone component ABSENT.    GYN HISTORY:    Component Value Date/Time   DIAGPAP  10/25/2023 1108    - Negative for intraepithelial lesion or malignancy (NILM)   ADEQPAP  10/25/2023 1108    Satisfactory for evaluation; transformation zone component ABSENT.    OB History  Gravida Para Term Preterm AB Living  2 2    2   SAB IAB Ectopic Multiple Live Births      2    # Outcome Date GA Lbr Len/2nd Weight Sex Type Anes PTL Lv  2 Para     F Vag-Spont     1 Para     F Vag-Spont       Past Medical History:  Diagnosis Date   Dementia (HCC)    Family history of ovarian cancer    High cholesterol    Hypertension    Memory change     Past Surgical History:  Procedure Laterality Date   BREAST SURGERY     LEFT BREAST BIOPSY   COLONOSCOPY W/ POLYPECTOMY  2002   BENIGN   ENDOMETRIAL ABLATION  1997   RESECTOSCOPIC POLYPECTOMY   TUBAL LIGATION  1977    Current Outpatient Medications on File Prior to Visit  Medication Sig Dispense Refill   acetaminophen  (TYLENOL ) 500 MG tablet Take 2 tablets (1,000 mg total) by mouth every 8 (eight) hours as needed. 30 tablet 0   donepezil (ARICEPT) 10 MG tablet Take 10 mg by  mouth at bedtime.     furosemide (LASIX) 40 MG tablet Take 40 mg by mouth every morning. 1/2 tab     metoprolol  succinate (TOPROL -XL) 25 MG 24 hr tablet Take 0.5 tablets (12.5 mg total) by mouth daily. 45 tablet 3   potassium chloride  (KLOR-CON ) 10 MEQ tablet Take 20 mEq by mouth daily.     simvastatin (ZOCOR) 40 MG tablet Take 40 mg by mouth at bedtime.       triamterene-hydrochlorothiazide (MAXZIDE-25) 37.5-25 MG tablet Take 0.5 tablets by mouth daily.     donepezil (ARICEPT) 5 MG tablet Take 5 mg by mouth at bedtime. (Patient not taking: Reported on 02/14/2024)     misoprostol  (CYTOTEC ) 200 MCG tablet Place 2 tablets (400 mcg total) vaginally once for 1 dose. Place 2 tablets vaginally night before procedure (Patient not taking: Reported on 02/14/2024) 2 tablet 0   misoprostol  (CYTOTEC ) 200 MCG tablet Take 2 tablets (400 mcg total) by mouth once for 1 dose. If unable to place vaginally, will take 400 mcg orally once . (Patient not taking: Reported on 02/14/2024) 2 tablet 0   No current facility-administered medications on file prior to visit.  Social History   Socioeconomic History   Marital status: Married    Spouse name: Not on file   Number of children: Not on file   Years of education: Not on file   Highest education level: Not on file  Occupational History   Not on file  Tobacco Use   Smoking status: Never   Smokeless tobacco: Never  Vaping Use   Vaping status: Never Used  Substance and Sexual Activity   Alcohol use: Not Currently    Comment: wine    Drug use: No   Sexual activity: Not Currently    Partners: Male    Birth control/protection: Post-menopausal    Comment: 1st intercorse- 16, married- 54 yrs   Other Topics Concern   Not on file  Social History Narrative   Not on file   Social Drivers of Health   Financial Resource Strain: Not on file  Food Insecurity: Not on file  Transportation Needs: Not on file  Physical Activity: Not on file  Stress: Not on  file  Social Connections: Not on file  Intimate Partner Violence: Not on file    Family History  Problem Relation Age of Onset   Ovarian cancer Mother 73     Allergies  Allergen Reactions   No Known Allergies       Patient's last menstrual period was No LMP recorded. Patient is postmenopausal..            Review of Systems Alls systems reviewed and are negative.     Physical Exam Constitutional:      Appearance: Normal appearance.  Genitourinary:     Vulva and urethral meatus normal.     No lesions in the vagina.     Right Labia: No rash, lesions or skin changes.    Left Labia: No lesions, skin changes or rash.    No vaginal discharge or tenderness.     No vaginal prolapse present.    No vaginal atrophy present.     Right Adnexa: not tender, not palpable and no mass present.    Left Adnexa: not tender, not palpable and no mass present.    No cervical motion tenderness or discharge.     Uterus is not enlarged, tender or irregular.  Breasts:    Right: Normal.     Left: Normal.  HENT:     Head: Normocephalic.  Neck:     Thyroid : No thyroid  mass, thyromegaly or thyroid  tenderness.  Cardiovascular:     Rate and Rhythm: Normal rate and regular rhythm.     Heart sounds: Normal heart sounds, S1 normal and S2 normal.  Pulmonary:     Effort: Pulmonary effort is normal.     Breath sounds: Normal breath sounds and air entry.  Abdominal:     General: There is no distension.     Palpations: Abdomen is soft. There is no mass.     Tenderness: There is no abdominal tenderness. There is no guarding or rebound.  Musculoskeletal:        General: Normal range of motion.     Cervical back: Full passive range of motion without pain, normal range of motion and neck supple. No tenderness.     Right lower leg: No edema.     Left lower leg: No edema.  Neurological:     Mental Status: She is alert.  Skin:    General: Skin is warm.  Psychiatric:        Mood and Affect: Mood  normal.        Behavior: Behavior normal.        Thought Content: Thought content normal.  Vitals and nursing note reviewed. Exam conducted with a chaperone present.    Darice, CMA was present for the exam   A:         Well Woman medicare GYN exam                             P:        Pap smear not indicated Encouraged annual mammogram screening Colon cancer screening referral placed today DXA ordered today Labs and immunizations to do with PMD Discussed breast self exams Encouraged healthy lifestyle practices Encouraged Vit D and Calcium  PUS follow-up in 6 months No follow-ups on file.  Elizabeth Castro

## 2024-02-14 NOTE — Patient Instructions (Signed)
 Locations you can schedule your bone scan are:  The [facility] bone scan location scheduling line is [phone]  [facility] is [phone]  [facility] radiology [phone]   [facility] [phone]  [facility]: [phone]    Another option if they are behind is [facility] and their number is [phone].  I can send the referral if you want to go there.    Please let me know if you have any trouble scheduling. This is important for management of osteoporosis or osteopenia.   I can sit down with you after the scan to discuss results and treatment.  Dr. Glennon

## 2024-02-25 DIAGNOSIS — I509 Heart failure, unspecified: Secondary | ICD-10-CM | POA: Diagnosis not present

## 2024-02-25 DIAGNOSIS — E7800 Pure hypercholesterolemia, unspecified: Secondary | ICD-10-CM | POA: Diagnosis not present

## 2024-02-25 DIAGNOSIS — N1831 Chronic kidney disease, stage 3a: Secondary | ICD-10-CM | POA: Diagnosis not present

## 2024-02-25 DIAGNOSIS — F0390 Unspecified dementia without behavioral disturbance: Secondary | ICD-10-CM | POA: Diagnosis not present

## 2024-03-08 ENCOUNTER — Other Ambulatory Visit: Payer: Self-pay | Admitting: Obstetrics and Gynecology

## 2024-03-08 NOTE — Telephone Encounter (Signed)
 Med refill request:   misoprostol  (CYTOTEC ) 200 MCG tablet (Expired)  Start:  11/09/23 Disp: 2  tablets Refills:  0  Last AEX:  02/14/24 Next AEX:  Not yet scheduled Last MMG (if hormonal med):  N/A Refill authorized? Please Advise.

## 2024-08-13 ENCOUNTER — Other Ambulatory Visit
